# Patient Record
Sex: Female | Born: 1975 | Race: White | Hispanic: No | Marital: Married | Smoking: Never smoker
Health system: Southern US, Community
[De-identification: ages and names within clinical notes are randomized; demographics above are authoritative.]

## PROBLEM LIST (undated history)

## (undated) DIAGNOSIS — H8109 Meniere's disease, unspecified ear: Secondary | ICD-10-CM

## (undated) DIAGNOSIS — E079 Disorder of thyroid, unspecified: Secondary | ICD-10-CM

## (undated) DIAGNOSIS — E785 Hyperlipidemia, unspecified: Secondary | ICD-10-CM

## (undated) HISTORY — DX: Hyperlipidemia, unspecified: E78.5

## (undated) HISTORY — PX: OTHER SURGICAL HISTORY: SHX169

## (undated) HISTORY — DX: Meniere's disease, unspecified ear: H81.09

## (undated) HISTORY — PX: ABDOMINAL HYSTERECTOMY: SHX81

## (undated) HISTORY — DX: Disorder of thyroid, unspecified: E07.9

---

## 2013-02-04 ENCOUNTER — Encounter: Payer: Self-pay | Admitting: Nurse Practitioner

## 2013-02-04 ENCOUNTER — Ambulatory Visit (INDEPENDENT_AMBULATORY_CARE_PROVIDER_SITE_OTHER): Payer: BC Managed Care – PPO | Admitting: Nurse Practitioner

## 2013-02-04 VITALS — BP 133/88 | HR 74 | Temp 97.3°F | Ht 64.0 in | Wt 165.5 lb

## 2013-02-04 DIAGNOSIS — E039 Hypothyroidism, unspecified: Secondary | ICD-10-CM

## 2013-02-04 DIAGNOSIS — R1013 Epigastric pain: Secondary | ICD-10-CM

## 2013-02-04 DIAGNOSIS — R079 Chest pain, unspecified: Secondary | ICD-10-CM

## 2013-02-04 DIAGNOSIS — E785 Hyperlipidemia, unspecified: Secondary | ICD-10-CM

## 2013-02-04 DIAGNOSIS — Z0000 Encounter for general adult medical examination without abnormal findings: Secondary | ICD-10-CM

## 2013-02-04 LAB — THYROID PANEL WITH TSH
Free Thyroxine Index: 3 (ref 1.0–3.9)
T4, Total: 14 ug/dL — ABNORMAL HIGH (ref 5.0–12.5)
TSH: 2.75 u[IU]/mL (ref 0.350–4.500)

## 2013-02-04 LAB — COMPLETE METABOLIC PANEL WITH GFR
ALT: 9 U/L (ref 0–35)
AST: 14 U/L (ref 0–37)
BUN: 12 mg/dL (ref 6–23)
Calcium: 9.3 mg/dL (ref 8.4–10.5)
Creat: 1.1 mg/dL (ref 0.50–1.10)
Total Bilirubin: 0.3 mg/dL (ref 0.3–1.2)

## 2013-02-04 MED ORDER — LEVOTHYROXINE SODIUM 75 MCG PO TABS: 75.0000 ug | ORAL_TABLET | Freq: Every day | ORAL | Status: DC

## 2013-02-04 MED ORDER — PANTOPRAZOLE SODIUM 40 MG PO TBEC: 40.0000 mg | DELAYED_RELEASE_TABLET | Freq: Every day | ORAL | Status: DC

## 2013-02-04 NOTE — Progress Notes (Addendum)
Subjective:    Patient ID: Tonya Hoffman, female    DOB: 05/05/1976, 37 y.o.   MRN: [id number]  HPI Patient in complaining of pain chest pain. Started . intermittent. Rates pain 3-4/10. Nothing helps pain just goes away on it own. Nothing  increases pain. Usually feel sin evenings and at night when she lays down. Eats a lot of spicy and fatty foods. HYPOTHYROIDISM- Levothyroxine no C/O side effects Hyperlipidemia- Pt stopped meds because she thinks they make her feel bad. Some muscle achiness    Review of Systems  Constitutional: Negative.   HENT: Negative.   Eyes: Negative.   Respiratory: Positive for chest tightness. Negative for cough, choking and shortness of breath.   Cardiovascular: Positive for chest pain. Negative for palpitations and leg swelling.  Gastrointestinal: Negative for diarrhea. Constipation: constant   Endocrine: Negative.   Genitourinary: Negative.   Musculoskeletal: Negative.   Allergic/Immunologic: Negative.   Neurological: Negative.   Hematological: Negative.   Psychiatric/Behavioral: Negative.    Allergies  Allergen Reactions  . Doxycycline     Outpatient Encounter Prescriptions as of 02/04/2013  Medication Sig Dispense Refill  . estrogens, conjugated, (PREMARIN) 0.625 MG tablet Take 0.625 mg by mouth daily. Take daily for 21 days then do not take for 7 days.      Marland Kitchen levothyroxine (SYNTHROID, LEVOTHROID) 75 MCG tablet Take 75 mcg by mouth daily.      Marland Kitchen atorvastatin (LIPITOR) 40 MG tablet Take 40 mg by mouth daily.      . fenofibrate (TRICOR) 145 MG tablet Take 145 mg by mouth daily.       No facility-administered encounter medications on file as of 02/04/2013.    Past Medical History  Diagnosis Date  . Thyroid disease     Past Surgical History  Procedure Laterality Date  . Cesarean section    . Abdominal hysterectomy    . Laporascopy      History   Social History  . Marital Status: Married    Spouse Name: N/A    Number of Children:  N/A  . Years of Education: N/A   Occupational History  . Not on file.   Social History Main Topics  . Smoking status: Never Smoker   . Smokeless tobacco: Not on file  . Alcohol Use: No  . Drug Use: No  . Sexually Active: Yes    Birth Control/ Protection: Surgical   Other Topics Concern  . Not on file   Social History Narrative  . No narrative on file          Objective:   Physical Exam  Constitutional: She is oriented to person, place, and time. She appears well-developed and well-nourished. No distress.  HENT:  Head: Normocephalic.  Nose: Nose normal.  Eyes: EOM are normal. Pupils are equal, round, and reactive to light.  Neck: Normal range of motion. Neck supple. No tracheal deviation present. No thyromegaly present.  Cardiovascular: Normal rate, regular rhythm, normal heart sounds and intact distal pulses.  Exam reveals no gallop and no friction rub.   No murmur heard. Pulmonary/Chest: Effort normal and breath sounds normal.  Abdominal: Soft. Bowel sounds are normal. She exhibits no mass. There is tenderness (midepigastric). There is no rebound and no guarding.  Musculoskeletal: Normal range of motion.  Lymphadenopathy:    She has no cervical adenopathy.  Neurological: She is alert and oriented to person, place, and time.  Skin: Skin is warm and dry. She is not diaphoretic.  Psychiatric: She has a  normal mood and affect. Her behavior is normal. Judgment and thought content normal.  BP 133/88  Pulse 74  Temp(Src) 97.3 F (36.3 C) (Oral)  Ht 5\' 4"  (1.626 m)  Wt 165 lb 8 oz (75.07 kg)  BMI 28.39 kg/m2  LMP 02/04/2005  EKG NSR       Assessment & Plan:  1. Chest Pain - EKG 12-Lead  2. Abdominal pain, epigastric protonix 40mg  1 PO qd Avoid spicy and fatty foods No acidic drinks 3.Other and unspecified hyperlipidemia  - COMPLETE METABOLIC PANEL WITH GFR - NMR Lipoprofile with Lipids Patient really needs to go back on meds. Will wait and see what labs  look like  4. Unspecified hypothyroidism - Thyroid Panel With TSH - levothyroxine (SYNTHROID, LEVOTHROID) 75 MCG tablet; Take 1 tablet (75 mcg total) by mouth daily.  Dispense: 30 tablet; Refill: 5  Tonya Daphine Deutscher, FNP

## 2013-02-04 NOTE — Patient Instructions (Signed)
Diet for Gastroesophageal Reflux Disease, Adult  Reflux (acid reflux) is when acid from your stomach flows up into the esophagus. When acid comes in contact with the esophagus, the acid causes irritation and soreness (inflammation) in the esophagus. When reflux happens often or so severely that it causes damage to the esophagus, it is called gastroesophageal reflux disease (GERD). Nutrition therapy can help ease the discomfort of GERD.  FOODS OR DRINKS TO AVOID OR LIMIT   Smoking or chewing tobacco. Nicotine is one of the most potent stimulants to acid production in the gastrointestinal tract.   Caffeinated and decaffeinated coffee and black tea.   Regular or low-calorie carbonated beverages or energy drinks (caffeine-free carbonated beverages are allowed).    Strong spices, such as black pepper, white pepper, red pepper, cayenne, curry powder, and chili powder.   Peppermint or spearmint.   Chocolate.   High-fat foods, including meats and fried foods. Extra added fats including oils, butter, salad dressings, and nuts. Limit these to less than 8 tsp per day.   Fruits and vegetables if they are not tolerated, such as citrus fruits or tomatoes.   Alcohol.   Any food that seems to aggravate your condition.  If you have questions regarding your diet, call your caregiver or a registered dietitian.  OTHER THINGS THAT MAY HELP GERD INCLUDE:    Eating your meals slowly, in a relaxed setting.   Eating 5 to 6 small meals per day instead of 3 large meals.   Eliminating food for a period of time if it causes distress.   Not lying down until 3 hours after eating a meal.   Keeping the head of your bed raised 6 to 9 inches (15 to 23 cm) by using a foam wedge or blocks under the legs of the bed. Lying flat may make symptoms worse.   Being physically active. Weight loss may be helpful in reducing reflux in overweight or obese adults.   Wear loose fitting clothing  EXAMPLE MEAL PLAN  This meal plan is approximately  2,000 calories based on [employer] meal planning guidelines.  Breakfast    cup cooked oatmeal.   1 cup strawberries.   1 cup low-fat milk.   1 oz almonds.  Snack   1 cup cucumber slices.   6 oz yogurt (made from low-fat or fat-free milk).  Lunch   2 slice whole-wheat bread.   2 oz sliced turkey.   2 tsp mayonnaise.   1 cup blueberries.   1 cup snap peas.  Snack   6 whole-wheat crackers.   1 oz string cheese.  Dinner    cup brown rice.   1 cup mixed veggies.   1 tsp olive oil.   3 oz grilled fish.  Document Released: 10/31/2005 Document Revised: 01/23/2012 Document Reviewed: 09/16/2011  [employer] Patient Information 2013 [employer].

## 2013-02-04 NOTE — Addendum Note (Signed)
Addended by: Bennie Pierini on: 02/04/2013 05:49 PM   Modules accepted: Orders

## 2013-02-06 ENCOUNTER — Encounter: Payer: Self-pay | Admitting: Nurse Practitioner

## 2013-02-06 LAB — NMR LIPOPROFILE WITH LIPIDS
Cholesterol, Total: 271 mg/dL — ABNORMAL HIGH (ref <200)
HDL Particle Number: 32.9 umol/L (ref >=30.5)
HDL-C: 49 mg/dL (ref >=40)
LDL (calc): 167 mg/dL — ABNORMAL HIGH (ref <100)
LDL Particle Number: 2625 nmol/L — ABNORMAL HIGH (ref <1000)
LDL Size: 20.2 nm — ABNORMAL LOW (ref >20.5)
LP-IR Score: 43 (ref <=45)
Small LDL Particle Number: 1704 nmol/L — ABNORMAL HIGH (ref <=527)
VLDL Size: 49.8 nm — ABNORMAL HIGH (ref >=46.6)

## 2013-02-06 NOTE — Progress Notes (Deleted)
  Subjective:    Patient ID: Tonya Hoffman, female    DOB: [birth year], 36 y.o.   MRN: [id number]  HPI    Review of Systems     Objective:   Physical Exam        Assessment & Plan:   CPE

## 2013-02-06 NOTE — Progress Notes (Signed)
Patient aware of labs.  

## 2013-03-29 ENCOUNTER — Telehealth: Payer: Self-pay | Admitting: Nurse Practitioner

## 2013-03-29 DIAGNOSIS — E039 Hypothyroidism, unspecified: Secondary | ICD-10-CM

## 2013-03-29 DIAGNOSIS — K219 Gastro-esophageal reflux disease without esophagitis: Secondary | ICD-10-CM

## 2013-03-29 MED ORDER — LEVOTHYROXINE SODIUM 75 MCG PO TABS: 75.0000 ug | ORAL_TABLET | Freq: Every day | ORAL | Status: DC

## 2013-03-29 MED ORDER — PANTOPRAZOLE SODIUM 40 MG PO TBEC: 40.0000 mg | DELAYED_RELEASE_TABLET | Freq: Every day | ORAL | Status: DC

## 2013-03-29 NOTE — Telephone Encounter (Signed)
protonix and levothyroxine rx sent to pharmacy

## 2013-03-29 NOTE — Telephone Encounter (Signed)
Mmm to address 

## 2013-03-30 NOTE — Telephone Encounter (Signed)
Pt aware of meds at her pharmacy

## 2013-04-05 ENCOUNTER — Telehealth: Payer: Self-pay | Admitting: Nurse Practitioner

## 2013-04-09 ENCOUNTER — Telehealth: Payer: Self-pay | Admitting: Nurse Practitioner

## 2013-04-10 NOTE — Telephone Encounter (Signed)
rx printed by accident, so i called it into [facility] on 04/09/13

## 2013-06-10 ENCOUNTER — Other Ambulatory Visit: Payer: Self-pay | Admitting: Nurse Practitioner

## 2013-08-20 ENCOUNTER — Telehealth: Payer: Self-pay | Admitting: Nurse Practitioner

## 2013-08-20 NOTE — Telephone Encounter (Signed)
pATIENT HAS AN APPOINTMENT 10:30 ON 08/22/13 WITH MMM

## 2013-08-22 ENCOUNTER — Ambulatory Visit: Payer: Self-pay | Admitting: Nurse Practitioner

## 2013-08-26 ENCOUNTER — Encounter: Payer: Self-pay | Admitting: General Practice

## 2013-08-26 ENCOUNTER — Encounter (INDEPENDENT_AMBULATORY_CARE_PROVIDER_SITE_OTHER): Payer: Self-pay

## 2013-08-26 ENCOUNTER — Ambulatory Visit (INDEPENDENT_AMBULATORY_CARE_PROVIDER_SITE_OTHER): Payer: BC Managed Care – PPO | Admitting: General Practice

## 2013-08-26 VITALS — BP 122/81 | HR 68 | Temp 97.7°F | Ht 64.0 in | Wt 170.5 lb

## 2013-08-26 DIAGNOSIS — R5383 Other fatigue: Secondary | ICD-10-CM

## 2013-08-26 DIAGNOSIS — E785 Hyperlipidemia, unspecified: Secondary | ICD-10-CM

## 2013-08-26 DIAGNOSIS — E039 Hypothyroidism, unspecified: Secondary | ICD-10-CM

## 2013-08-26 DIAGNOSIS — R5381 Other malaise: Secondary | ICD-10-CM

## 2013-08-26 NOTE — Progress Notes (Signed)
  Subjective:    Patient ID: Tonya Hoffman, female    DOB: [birth year], 37 y.o.   MRN: [id number]  HPI Patient presents today for follow up of chronic health conditions. She has a history of hypothyroidism, hyperlipidemia, vasomotor symptoms, and GERD. She reports taking medications as prescribed. Denies any questions or concerns. Reports eating a healthy diet and regular exercise.     Review of Systems  Constitutional: Negative for fever and chills.  Respiratory: Negative for chest tightness and shortness of breath.   Cardiovascular: Negative for chest pain.  Gastrointestinal: Negative for nausea, vomiting, abdominal pain, diarrhea and constipation.  Genitourinary: Negative for hematuria and difficulty urinating.  Neurological: Negative for dizziness, weakness, numbness and headaches.  Psychiatric/Behavioral: Negative for sleep disturbance. The patient is not nervous/anxious.        Objective:   Physical Exam  Constitutional: She is oriented to person, place, and time. She appears well-developed and well-nourished.  HENT:  Head: Normocephalic and atraumatic.  Right Ear: External ear normal.  Left Ear: External ear normal.  Nose: Nose normal.  Mouth/Throat: Oropharynx is clear and moist.  Eyes: EOM are normal. Pupils are equal, round, and reactive to light.  Neck: Normal range of motion. Neck supple. No thyromegaly present.  Cardiovascular: Normal rate, regular rhythm and normal heart sounds.   Pulmonary/Chest: Effort normal and breath sounds normal. No respiratory distress. She exhibits no tenderness.  Abdominal: Soft. Bowel sounds are normal. She exhibits no distension. There is no tenderness.  Musculoskeletal: She exhibits no edema and no tenderness.  Lymphadenopathy:    She has no cervical adenopathy.  Neurological: She is alert and oriented to person, place, and time.  Skin: Skin is warm and dry.  Psychiatric: She has a normal mood and affect.          Assessment & Plan:   1. Unspecified hypothyroidism  - Thyroid Panel With TSH; Future  2. Fatigue  - POCT CBC - CMP14+EGFR; Future - Vitamin B12; Future - Vit D  25 hydroxy (rtn osteoporosis monitoring); Future  3. Other and unspecified hyperlipidemia  - NMR, lipoprofile; Future Continue all current medications Labs pending F/u in 3 months Discussed exercise and diet  Patient verbalized understanding Coralie Keens, FNP-C

## 2013-08-26 NOTE — Patient Instructions (Addendum)
Fatigue Fatigue is a feeling of tiredness, lack of energy, lack of motivation, or feeling tired all the time. Having enough rest, good nutrition, and reducing stress will normally reduce fatigue. Consult your caregiver if it persists. The nature of your fatigue will help your caregiver to find out its cause. The treatment is based on the cause.  CAUSES  There are many causes for fatigue. Most of the time, fatigue can be traced to one or more of your habits or routines. Most causes fit into one or more of three general areas. They are: Lifestyle problems  Sleep disturbances.  Overwork.  Physical exertion.  Unhealthy habits.  Poor eating habits or eating disorders.  Alcohol and/or drug use .  Lack of proper nutrition (malnutrition). Psychological problems  Stress and/or anxiety problems.  Depression.  Grief.  Boredom. Medical Problems or Conditions  Anemia.  Pregnancy.  Thyroid gland problems.  Recovery from major surgery.  Continuous pain.  Emphysema or asthma that is not well controlled  Allergic conditions.  Diabetes.  Infections (such as mononucleosis).  Obesity.  Sleep disorders, such as sleep apnea.  Heart failure or other heart-related problems.  Cancer.  Kidney disease.  Liver disease.  Effects of certain medicines such as antihistamines, cough and cold remedies, prescription pain medicines, heart and blood pressure medicines, drugs used for treatment of cancer, and some antidepressants. SYMPTOMS  The symptoms of fatigue include:   Lack of energy.  Lack of drive (motivation).  Drowsiness.  Feeling of indifference to the surroundings. DIAGNOSIS  The details of how you feel help guide your caregiver in finding out what is causing the fatigue. You will be asked about your present and past health condition. It is important to review all medicines that you take, including prescription and non-prescription items. A thorough exam will be done.  You will be questioned about your feelings, habits, and normal lifestyle. Your caregiver may suggest blood tests, urine tests, or other tests to look for common medical causes of fatigue.  TREATMENT  Fatigue is treated by correcting the underlying cause. For example, if you have continuous pain or depression, treating these causes will improve how you feel. Similarly, adjusting the dose of certain medicines will help in reducing fatigue.  HOME CARE INSTRUCTIONS   Try to get the required amount of good sleep every night.  Eat a healthy and nutritious diet, and drink enough water throughout the day.  Practice ways of relaxing (including yoga or meditation).  Exercise regularly.  Make plans to change situations that cause stress. Act on those plans so that stresses decrease over time. Keep your work and personal routine reasonable.  Avoid street drugs and minimize use of alcohol.  Start taking a daily multivitamin after consulting your caregiver. SEEK MEDICAL CARE IF:   You have persistent tiredness, which cannot be accounted for.  You have fever.  You have unintentional weight loss.  You have headaches.  You have disturbed sleep throughout the night.  You are feeling sad.  You have constipation.  You have dry skin.  You have gained weight.  You are taking any new or different medicines that you suspect are causing fatigue.  You are unable to sleep at night.  You develop any unusual swelling of your legs or other parts of your body. SEEK IMMEDIATE MEDICAL CARE IF:   You are feeling confused.  Your vision is blurred.  You feel faint or pass out.  You develop severe headache.  You develop severe abdominal, pelvic, or   back pain.  You develop chest pain, shortness of breath, or an irregular or fast heartbeat.  You are unable to pass a normal amount of urine.  You develop abnormal bleeding such as bleeding from the rectum or you vomit blood.  You have thoughts  about harming yourself or committing suicide.  You are worried that you might harm someone else. MAKE SURE YOU:   Understand these instructions.  Will watch your condition.  Will get help right away if you are not doing well or get worse. Document Released: 08/28/2007 Document Revised: 01/23/2012 Document Reviewed: 08/28/2007 [facility] Patient Information 2014 [place], [place].  Hypertriglyceridemia  Diet for High blood levels of Triglycerides Most fats in food are triglycerides. Triglycerides in your blood are stored as fat in your body. High levels of triglycerides in your blood may put you at a greater risk for heart disease and stroke.  Normal triglyceride levels are less than 150 mg/dL. Borderline high levels are 150-199 mg/dl. High levels are 200 - 499 mg/dL, and very high triglyceride levels are greater than 500 mg/dL. The decision to treat high triglycerides is generally based on the level. For people with borderline or high triglyceride levels, treatment includes weight loss and exercise. Drugs are recommended for people with very high triglyceride levels. Many people who need treatment for high triglyceride levels have metabolic syndrome. This syndrome is a collection of disorders that often include: insulin resistance, high blood pressure, blood clotting problems, high cholesterol and triglycerides. TESTING PROCEDURE FOR TRIGLYCERIDES  You should not eat 4 hours before getting your triglycerides measured. The normal range of triglycerides is between 10 and 250 milligrams per deciliter (mg/dl). Some people may have extreme levels (1000 or above), but your triglyceride level may be too high if it is above 150 mg/dl, depending on what other risk factors you have for heart disease.  People with high blood triglycerides may also have high blood cholesterol levels. If you have high blood cholesterol as well as high blood triglycerides, your risk for heart disease is probably greater than if  you only had high triglycerides. High blood cholesterol is one of the main risk factors for heart disease. CHANGING YOUR DIET  Your weight can affect your blood triglyceride level. If you are more than 20% above your ideal body weight, you may be able to lower your blood triglycerides by losing weight. Eating less and exercising regularly is the best way to combat this. Fat provides more calories than any other food. The best way to lose weight is to eat less fat. Only 30% of your total calories should come from fat. Less than 7% of your diet should come from saturated fat. A diet low in fat and saturated fat is the same as a diet to decrease blood cholesterol. By eating a diet lower in fat, you may lose weight, lower your blood cholesterol, and lower your blood triglyceride level.  Eating a diet low in fat, especially saturated fat, may also help you lower your blood triglyceride level. Ask your dietitian to help you figure how much fat you can eat based on the number of calories your caregiver has prescribed for you.  Exercise, in addition to helping with weight loss may also help lower triglyceride levels.   Alcohol can increase blood triglycerides. You may need to stop drinking alcoholic beverages.  Too much carbohydrate in your diet may also increase your blood triglycerides. Some complex carbohydrates are necessary in your diet. These may include bread, rice, potatoes, other  starchy vegetables and cereals.  Reduce "simple" carbohydrates. These may include pure sugars, candy, honey, and jelly without losing other nutrients. If you have the kind of high blood triglycerides that is affected by the amount of carbohydrates in your diet, you will need to eat less sugar and less high-sugar foods. Your caregiver can help you with this.  Adding 2-4 grams of fish oil (EPA+ DHA) may also help lower triglycerides. Speak with your caregiver before adding any supplements to your regimen. Following the Diet   Maintain your ideal weight. Your caregivers can help you with a diet. Generally, eating less food and getting more exercise will help you lose weight. Joining a weight control group may also help. Ask your caregivers for a good weight control group in your area.  Eat low-fat foods instead of high-fat foods. This can help you lose weight too.  These foods are lower in fat. Eat MORE of these:   Dried beans, peas, and lentils.  Egg whites.  Low-fat cottage cheese.  Fish.  Lean cuts of meat, such as round, sirloin, rump, and flank (cut extra fat off meat you fix).  Whole grain breads, cereals and pasta.  Skim and nonfat dry milk.  Low-fat yogurt.  Poultry without the skin.  Cheese made with skim or part-skim milk, such as mozzarella, parmesan, farmers', ricotta, or pot cheese. These are higher fat foods. Eat LESS of these:   Whole milk and foods made from whole milk, such as American, blue, cheddar, monterey jack, and swiss cheese  High-fat meats, such as luncheon meats, sausages, knockwurst, bratwurst, hot dogs, ribs, corned beef, ground pork, and regular ground beef.  Fried foods. Limit saturated fats in your diet. Substituting unsaturated fat for saturated fat may decrease your blood triglyceride level. You will need to read package labels to know which products contain saturated fats.  These foods are high in saturated fat. Eat LESS of these:   Fried pork skins.  Whole milk.  Skin and fat from poultry.  Palm oil.  Butter.  Shortening.  Cream cheese.  Tomasa Blase.  Margarines and baked goods made from listed oils.  Vegetable shortenings.  Chitterlings.  Fat from meats.  Coconut oil.  Palm kernel oil.  Lard.  Cream.  Sour cream.  Fatback.  Coffee whiteners and non-dairy creamers made with these oils.  Cheese made from whole milk. Use unsaturated fats (both polyunsaturated and monounsaturated) moderately. Remember, even though unsaturated fats are  better than saturated fats; you still want a diet low in total fat.  These foods are high in unsaturated fat:   Canola oil.  Sunflower oil.  Mayonnaise.  Almonds.  Peanuts.  Pine nuts.  Margarines made with these oils.  Safflower oil.  Olive oil.  Avocados.  Cashews.  Peanut butter.  Sunflower seeds.  Soybean oil.  Peanut oil.  Olives.  Pecans.  Walnuts.  Pumpkin seeds. Avoid sugar and other high-sugar foods. This will decrease carbohydrates without decreasing other nutrients. Sugar in your food goes rapidly to your blood. When there is excess sugar in your blood, your liver may use it to make more triglycerides. Sugar also contains calories without other important nutrients.  Eat LESS of these:   Sugar, brown sugar, powdered sugar, jam, jelly, preserves, honey, syrup, molasses, pies, candy, cakes, cookies, frosting, pastries, colas, soft drinks, punches, fruit drinks, and regular gelatin.  Avoid alcohol. Alcohol, even more than sugar, may increase blood triglycerides. In addition, alcohol is high in calories and low in nutrients. Ask  for sparkling water, or a diet soft drink instead of an alcoholic beverage. Suggestions for planning and preparing meals   Bake, broil, grill or roast meats instead of frying.  Remove fat from meats and skin from poultry before cooking.  Add spices, herbs, lemon juice or vinegar to vegetables instead of salt, rich sauces or gravies.  Use a non-stick skillet without fat or use no-stick sprays.  Cool and refrigerate stews and broth. Then remove the hardened fat floating on the surface before serving.  Refrigerate meat drippings and skim off fat to make low-fat gravies.  Serve more fish.  Use less butter, margarine and other high-fat spreads on bread or vegetables.  Use skim or reconstituted non-fat dry milk for cooking.  Cook with low-fat cheeses.  Substitute low-fat yogurt or cottage cheese for all or part of the sour  cream in recipes for sauces, dips or congealed salads.  Use half yogurt/half mayonnaise in salad recipes.  Substitute evaporated skim milk for cream. Evaporated skim milk or reconstituted non-fat dry milk can be whipped and substituted for whipped cream in certain recipes.  Choose fresh fruits for dessert instead of high-fat foods such as pies or cakes. Fruits are naturally low in fat. When Dining Out   Order low-fat appetizers such as fruit or vegetable juice, pasta with vegetables or tomato sauce.  Select clear, rather than cream soups.  Ask that dressings and gravies be served on the side. Then use less of them.  Order foods that are baked, broiled, poached, steamed, stir-fried, or roasted.  Ask for margarine instead of butter, and use only a small amount.  Drink sparkling water, unsweetened tea or coffee, or diet soft drinks instead of alcohol or other sweet beverages. QUESTIONS AND ANSWERS ABOUT OTHER FATS IN THE BLOOD: SATURATED FAT, TRANS FAT, AND CHOLESTEROL What is trans fat? Trans fat is a type of fat that is formed when vegetable oil is hardened through a process called hydrogenation. This process helps makes foods more solid, gives them shape, and prolongs their shelf life. Trans fats are also called hydrogenated or partially hydrogenated oils.  What do saturated fat, trans fat, and cholesterol in foods have to do with heart disease? Saturated fat, trans fat, and cholesterol in the diet all raise the level of LDL "bad" cholesterol in the blood. The higher the LDL cholesterol, the greater the risk for coronary heart disease (CHD). Saturated fat and trans fat raise LDL similarly.  What foods contain saturated fat, trans fat, and cholesterol? High amounts of saturated fat are found in animal products, such as fatty cuts of meat, chicken skin, and full-fat dairy products like butter, whole milk, cream, and cheese, and in tropical vegetable oils such as palm, palm kernel, and coconut  oil. Trans fat is found in some of the same foods as saturated fat, such as vegetable shortening, some margarines (especially hard or stick margarine), crackers, cookies, baked goods, fried foods, salad dressings, and other processed foods made with partially hydrogenated vegetable oils. Small amounts of trans fat also occur naturally in some animal products, such as milk products, beef, and lamb. Foods high in cholesterol include liver, other organ meats, egg yolks, shrimp, and full-fat dairy products. How can I use the new food label to make heart-healthy food choices? Check the Nutrition Facts panel of the food label. Choose foods lower in saturated fat, trans fat, and cholesterol. For saturated fat and cholesterol, you can also use the Percent Daily Value (%DV): 5% DV or less  is low, and 20% DV or more is high. (There is no %DV for trans fat.) Use the Nutrition Facts panel to choose foods low in saturated fat and cholesterol, and if the trans fat is not listed, read the ingredients and limit products that list shortening or hydrogenated or partially hydrogenated vegetable oil, which tend to be high in trans fat. POINTS TO REMEMBER:   Discuss your risk for heart disease with your caregivers, and take steps to reduce risk factors.  Change your diet. Choose foods that are low in saturated fat, trans fat, and cholesterol.  Add exercise to your daily routine if it is not already being done. Participate in physical activity of moderate intensity, like brisk walking, for at least 30 minutes on most, and preferably all days of the week. No time? Break the 30 minutes into three, 10-minute segments during the day.  Stop smoking. If you do smoke, contact your caregiver to discuss ways in which they can help you quit.  Do not use street drugs.  Maintain a normal weight.  Maintain a healthy blood pressure.  Keep up with your blood work for checking the fats in your blood as directed by your  caregiver. Document Released: 08/18/2004 Document Revised: 05/01/2012 Document Reviewed: 03/16/2009 [facility] Patient Information 2014 [place], [place].

## 2013-08-27 ENCOUNTER — Other Ambulatory Visit (INDEPENDENT_AMBULATORY_CARE_PROVIDER_SITE_OTHER): Payer: BC Managed Care – PPO

## 2013-08-27 DIAGNOSIS — E785 Hyperlipidemia, unspecified: Secondary | ICD-10-CM

## 2013-08-27 DIAGNOSIS — E039 Hypothyroidism, unspecified: Secondary | ICD-10-CM

## 2013-08-27 DIAGNOSIS — R5381 Other malaise: Secondary | ICD-10-CM

## 2013-08-27 DIAGNOSIS — R5383 Other fatigue: Secondary | ICD-10-CM

## 2013-08-29 LAB — CMP14+EGFR
ALT: 12 IU/L (ref 0–32)
AST: 19 IU/L (ref 0–40)
Albumin: 4.3 g/dL (ref 3.5–5.5)
Alkaline Phosphatase: 69 IU/L (ref 39–117)
BUN/Creatinine Ratio: 10 (ref 8–20)
CO2: 26 mmol/L (ref 18–29)
Calcium: 9.1 mg/dL (ref 8.7–10.2)
Chloride: 104 mmol/L (ref 97–108)
GFR calc Af Amer: 85 mL/min/{1.73_m2} (ref >59)
Glucose: 84 mg/dL (ref 65–99)
Potassium: 4.3 mmol/L (ref 3.5–5.2)
Sodium: 143 mmol/L (ref 134–144)
Total Bilirubin: 0.3 mg/dL (ref 0.0–1.2)
Total Protein: 6.6 g/dL (ref 6.0–8.5)

## 2013-08-29 LAB — NMR, LIPOPROFILE
Cholesterol: 219 mg/dL — ABNORMAL HIGH (ref <200)
HDL Cholesterol by NMR: 45 mg/dL (ref >=40)
HDL Particle Number: 29.5 umol/L — ABNORMAL LOW (ref >=30.5)
LDL Particle Number: 1983 nmol/L — ABNORMAL HIGH (ref <1000)
LDL Size: 20.8 nm (ref >20.5)
LP-IR Score: 31 (ref <=45)

## 2013-08-29 LAB — THYROID PANEL WITH TSH
T3 Uptake Ratio: 26 % (ref 24–39)
TSH: 2.25 u[IU]/mL (ref 0.450–4.500)

## 2013-08-29 LAB — VITAMIN D 25 HYDROXY (VIT D DEFICIENCY, FRACTURES): Vit D, 25-Hydroxy: 29.1 ng/mL — ABNORMAL LOW (ref 30.0–100.0)

## 2013-10-24 ENCOUNTER — Other Ambulatory Visit: Payer: Self-pay | Admitting: Nurse Practitioner

## 2014-07-14 ENCOUNTER — Telehealth: Payer: Self-pay | Admitting: General Practice

## 2014-07-14 NOTE — Telephone Encounter (Signed)
Patient had labwork done by [facility] and will bring in for review. She will need med refills soon.

## 2014-08-27 ENCOUNTER — Other Ambulatory Visit: Payer: Self-pay | Admitting: General Practice

## 2014-08-28 NOTE — Telephone Encounter (Signed)
Last ov and labs 10/14. ntbs.

## 2014-08-28 NOTE — Telephone Encounter (Signed)
no more refills without being seen  

## 2014-08-29 NOTE — Telephone Encounter (Signed)
Message left that patient will need to be seen for any further refills

## 2014-09-23 ENCOUNTER — Telehealth: Payer: Self-pay | Admitting: Nurse Practitioner

## 2014-09-23 MED ORDER — LEVOTHYROXINE SODIUM 75 MCG PO TABS: ORAL_TABLET | ORAL | Status: DC

## 2014-09-23 NOTE — Telephone Encounter (Signed)
done

## 2014-12-09 ENCOUNTER — Ambulatory Visit (INDEPENDENT_AMBULATORY_CARE_PROVIDER_SITE_OTHER): Payer: BLUE CROSS/BLUE SHIELD | Admitting: Family

## 2014-12-09 ENCOUNTER — Encounter (INDEPENDENT_AMBULATORY_CARE_PROVIDER_SITE_OTHER): Payer: Self-pay

## 2014-12-09 ENCOUNTER — Encounter: Payer: Self-pay | Admitting: Family

## 2014-12-09 VITALS — BP 118/81 | HR 71 | Temp 98.0°F | Ht 64.0 in | Wt 178.2 lb

## 2014-12-09 DIAGNOSIS — R5383 Other fatigue: Secondary | ICD-10-CM

## 2014-12-09 DIAGNOSIS — E559 Vitamin D deficiency, unspecified: Secondary | ICD-10-CM

## 2014-12-09 DIAGNOSIS — E039 Hypothyroidism, unspecified: Secondary | ICD-10-CM

## 2014-12-09 DIAGNOSIS — Z0000 Encounter for general adult medical examination without abnormal findings: Secondary | ICD-10-CM

## 2014-12-09 DIAGNOSIS — J4520 Mild intermittent asthma, uncomplicated: Secondary | ICD-10-CM

## 2014-12-09 DIAGNOSIS — J45909 Unspecified asthma, uncomplicated: Secondary | ICD-10-CM | POA: Insufficient documentation

## 2014-12-09 DIAGNOSIS — E785 Hyperlipidemia, unspecified: Secondary | ICD-10-CM

## 2014-12-09 MED ORDER — ALBUTEROL SULFATE HFA 108 (90 BASE) MCG/ACT IN AERS: 2.0000 | INHALATION_SPRAY | Freq: Four times a day (QID) | RESPIRATORY_TRACT | Status: DC | PRN

## 2014-12-09 MED ORDER — LEVOTHYROXINE SODIUM 75 MCG PO TABS: ORAL_TABLET | ORAL | Status: DC

## 2014-12-09 MED ORDER — BUDESONIDE-FORMOTEROL FUMARATE 80-4.5 MCG/ACT IN AERO: 2.0000 | INHALATION_SPRAY | Freq: Two times a day (BID) | RESPIRATORY_TRACT | Status: DC

## 2014-12-09 MED ORDER — MONTELUKAST SODIUM 10 MG PO TABS: 10.0000 mg | ORAL_TABLET | Freq: Every day | ORAL | Status: DC

## 2014-12-09 NOTE — Addendum Note (Signed)
Addended by: Orma RenderHODGES, Lillyn Wieczorek F on: 12/09/2014 01:26 PM   Modules accepted: Orders

## 2014-12-09 NOTE — Progress Notes (Addendum)
Subjective:    Patient ID: Tonya Hoffman, female    DOB: 08-Jul-1976, 39 y.o.   MRN: [id number]  Pt presents for CPE with the following chronic problems: Thyroid Problem Presents for follow-up visit. Symptoms include cold intolerance, constipation, dry skin, fatigue, hair loss, palpitations and weight gain. Patient reports no anxiety, depressed mood, diarrhea or leg swelling. The symptoms have been worsening. Past treatments include levothyroxine. The treatment provided moderate relief. Her past medical history is significant for hyperlipidemia. There is no history of diabetes or heart failure.  Asthma She complains of cough. There is no difficulty breathing, hemoptysis, shortness of breath, sputum production or wheezing. This is a chronic problem. The current episode started more than 1 year ago. The problem occurs intermittently. The problem has been waxing and waning. The cough is non-productive. Associated symptoms include dyspnea on exertion and malaise/fatigue. Pertinent negatives include no headaches, nasal congestion, postnasal drip or sneezing. Her symptoms are not alleviated by rest. Her past medical history is significant for asthma. There is no history of COPD.      Review of Systems  Constitutional: Positive for weight gain, malaise/fatigue and fatigue.  HENT: Negative.  Negative for postnasal drip and sneezing.   Eyes: Negative.   Respiratory: Positive for cough. Negative for hemoptysis, sputum production, shortness of breath and wheezing.   Cardiovascular: Positive for dyspnea on exertion and palpitations.  Gastrointestinal: Positive for constipation. Negative for diarrhea.  Endocrine: Positive for cold intolerance.  Genitourinary: Negative.   Musculoskeletal: Negative.   Neurological: Negative.  Negative for headaches.  Hematological: Negative.   Psychiatric/Behavioral: Negative.  The patient is not nervous/anxious.   All other systems reviewed and are negative.        Objective:   Physical Exam  Constitutional: She is oriented to person, place, and time. She appears well-developed and well-nourished. No distress.  HENT:  Head: Normocephalic and atraumatic.  Right Ear: External ear normal.  Left Ear: External ear normal.  Nose: Nose normal.  Mouth/Throat: Oropharynx is clear and moist.  Eyes: Pupils are equal, round, and reactive to light.  Neck: Normal range of motion. Neck supple. No thyromegaly present.  Cardiovascular: Normal rate, regular rhythm, normal heart sounds and intact distal pulses.   No murmur heard. Pulmonary/Chest: Effort normal and breath sounds normal. No respiratory distress. She has no wheezes.  Abdominal: Soft. Bowel sounds are normal. She exhibits no distension. There is no tenderness.  Musculoskeletal: Normal range of motion. She exhibits no edema or tenderness.  Neurological: She is alert and oriented to person, place, and time. She has normal reflexes. No cranial nerve deficit.  Skin: Skin is warm and dry.  Psychiatric: She has a normal mood and affect. Her behavior is normal. Judgment and thought content normal.  Vitals reviewed.   BP 118/81 mmHg  Pulse 71  Temp(Src) 98 F (36.7 C) (Oral)  Ht $R'5\' 4"'KU$  (1.626 m)  Wt 178 lb 3.2 oz (80.831 kg)  BMI 30.57 kg/m2  LMP 02/04/2005       Assessment & Plan:  1. Hypothyroidism, unspecified hypothyroidism type - CMP14+EGFR; Future - levothyroxine (SYNTHROID, LEVOTHROID) 75 MCG tablet; TAKE ONE TABLET BY MOUTH ONCE DAILY  Dispense: 90 tablet; Refill: 3 - Thyroid antibodies  2. Asthma, chronic, mild intermittent, uncomplicated - OIZ12+WPYK; Future - budesonide-formoterol (SYMBICORT) 80-4.5 MCG/ACT inhaler; Inhale 2 puffs into the lungs 2 (two) times daily. two puffs twice daily  Dispense: 1 Inhaler; Refill: 11 - montelukast (SINGULAIR) 10 MG tablet; Take 1 tablet (10 mg total)  by mouth at bedtime.  Dispense: 90 tablet; Refill: 3 - albuterol (PROVENTIL HFA;VENTOLIN HFA) 108  (90 BASE) MCG/ACT inhaler; Inhale 2 puffs into the lungs every 6 (six) hours as needed for wheezing or shortness of breath.  Dispense: 1 Inhaler; Refill: 2  3. Hyperlipidemia - Lipid panel; Future  4. Other fatigue - Thyroid Panel With TSH; Future - Vit D  25 hydroxy (rtn osteoporosis monitoring); Future - Vitamin B12; Future  5. Vitamin D deficiency - Vit D  25 hydroxy (rtn osteoporosis monitoring); Future - Vitamin B12; Future   Continue all meds Labs pending Health Maintenance reviewed Diet and exercise encouraged RTO 1 year  Evelina Dun, FNP

## 2014-12-09 NOTE — Patient Instructions (Addendum)
Asthma Asthma is a recurring condition in which the airways tighten and narrow. Asthma can make it difficult to breathe. It can cause coughing, wheezing, and shortness of breath. Asthma episodes, also called asthma attacks, range from minor to life-threatening. Asthma cannot be cured, but medicines and lifestyle changes can help control it. CAUSES Asthma is believed to be caused by inherited (genetic) and environmental factors, but its exact cause is unknown. Asthma may be triggered by allergens, lung infections, or irritants in the air. Asthma triggers are different for each person. Common triggers include:   Animal dander.  Dust mites.  Cockroaches.  Pollen from trees or grass.  Mold.  Smoke.  Air pollutants such as dust, household cleaners, hair sprays, aerosol sprays, paint fumes, strong chemicals, or strong odors.  Cold air, weather changes, and winds (which increase molds and pollens in the air).  Strong emotional expressions such as crying or laughing hard.  Stress.  Certain medicines (such as aspirin) or types of drugs (such as beta-blockers).  Sulfites in foods and drinks. Foods and drinks that may contain sulfites include dried fruit, potato chips, and sparkling grape juice.  Infections or inflammatory conditions such as the flu, a cold, or an inflammation of the nasal membranes (rhinitis).  Gastroesophageal reflux disease (GERD).  Exercise or strenuous activity. SYMPTOMS Symptoms may occur immediately after asthma is triggered or many hours later. Symptoms include:  Wheezing.  Excessive nighttime or early morning coughing.  Frequent or severe coughing with a common cold.  Chest tightness.  Shortness of breath. DIAGNOSIS  The diagnosis of asthma is made by a review of your medical history and a physical exam. Tests may also be performed. These may include:  Lung function studies. These tests show how much air you breathe in and out.  Allergy  tests.  Imaging tests such as X-rays. TREATMENT  Asthma cannot be cured, but it can usually be controlled. Treatment involves identifying and avoiding your asthma triggers. It also involves medicines. There are 2 classes of medicine used for asthma treatment:   Controller medicines. These prevent asthma symptoms from occurring. They are usually taken every day.  Reliever or rescue medicines. These quickly relieve asthma symptoms. They are used as needed and provide short-term relief. Your health care provider will help you create an asthma action plan. An asthma action plan is a written plan for managing and treating your asthma attacks. It includes a list of your asthma triggers and how they may be avoided. It also includes information on when medicines should be taken and when their dosage should be changed. An action plan may also involve the use of a device called a peak flow meter. A peak flow meter measures how well the lungs are working. It helps you monitor your condition. HOME CARE INSTRUCTIONS   Take medicines only as directed by your health care provider. Speak with your health care provider if you have questions about how or when to take the medicines.  Use a peak flow meter as directed by your health care provider. Record and keep track of readings.  Understand and use the action plan to help minimize or stop an asthma attack without needing to seek medical care.  Control your home environment in the following ways to help prevent asthma attacks:  Do not smoke. Avoid being exposed to secondhand smoke.  Change your heating and air conditioning filter regularly.  Limit your use of fireplaces and wood stoves.  Get rid of pests (such as roaches and  mice) and their droppings.  Throw away plants if you see mold on them.  Clean your floors and dust regularly. Use unscented cleaning products.  Try to have someone else vacuum for you regularly. Stay out of rooms while they are  being vacuumed and for a short while afterward. If you vacuum, use a dust mask from a hardware store, a double-layered or microfilter vacuum cleaner bag, or a vacuum cleaner with a HEPA filter.  Replace carpet with wood, tile, or vinyl flooring. Carpet can trap dander and dust.  Use allergy-proof pillows, mattress covers, and box spring covers.  Wash bed sheets and blankets every week in hot water and dry them in a dryer.  Use blankets that are made of polyester or cotton.  Clean bathrooms and kitchens with bleach. If possible, have someone repaint the walls in these rooms with mold-resistant paint. Keep out of the rooms that are being cleaned and painted.  Wash hands frequently. SEEK MEDICAL CARE IF:   You have wheezing, shortness of breath, or a cough even if taking medicine to prevent attacks.  The colored mucus you cough up (sputum) is thicker than usual.  Your sputum changes from clear or white to yellow, green, gray, or bloody.  You have any problems that may be related to the medicines you are taking (such as a rash, itching, swelling, or trouble breathing).  You are using a reliever medicine more than 2-3 times per week.  Your peak flow is still at 50-79% of your personal best after following your action plan for 1 hour.  You have a fever. SEEK IMMEDIATE MEDICAL CARE IF:   You seem to be getting worse and are unresponsive to treatment during an asthma attack.  You are short of breath even at rest.  You get short of breath when doing very little physical activity.  You have difficulty eating, drinking, or talking due to asthma symptoms.  You develop chest pain.  You develop a fast heartbeat.  You have a bluish color to your lips or fingernails.  You are light-headed, dizzy, or faint.  Your peak flow is less than 50% of your personal best. MAKE SURE YOU:   Understand these instructions.  Will watch your condition.  Will get help right away if you are not  doing well or get worse. Document Released: 10/31/2005 Document Revised: 03/17/2014 Document Reviewed: 05/30/2013 [employer] Patient Information 2015 [place], [place]. This information is not intended to replace advice given to you by your health care provider. Make sure you discuss any questions you have with your health care provider. Health Maintenance Adopting a healthy lifestyle and getting preventive care can go a long way to promote health and wellness. Talk with your health care provider about what schedule of regular examinations is right for you. This is a good chance for you to check in with your provider about disease prevention and staying healthy. In between checkups, there are plenty of things you can do on your own. Experts have done a lot of research about which lifestyle changes and preventive measures are most likely to keep you healthy. Ask your health care provider for more information. WEIGHT AND DIET  Eat a healthy diet  Be sure to include plenty of vegetables, fruits, low-fat dairy products, and lean protein.  Do not eat a lot of foods high in solid fats, added sugars, or salt.  Get regular exercise. This is one of the most important things you can do for your health.  Most  adults should exercise for at least 150 minutes each week. The exercise should increase your heart rate and make you sweat (moderate-intensity exercise).  Most adults should also do strengthening exercises at least twice a week. This is in addition to the moderate-intensity exercise.  Maintain a healthy weight  Body mass index (BMI) is a measurement that can be used to identify possible weight problems. It estimates body fat based on height and weight. Your health care provider can help determine your BMI and help you achieve or maintain a healthy weight.  For females 39 years of age and older:   A BMI below 18.5 is considered underweight.  A BMI of 18.5 to 24.9 is normal.  A BMI of 25 to 29.9 is  considered overweight.  A BMI of 30 and above is considered obese.  Watch levels of cholesterol and blood lipids  You should start having your blood tested for lipids and cholesterol at 41 years of age, then have this test every 5 years.  You may need to have your cholesterol levels checked more often if:  Your lipid or cholesterol levels are high.  You are older than 41 years of age.  You are at high risk for heart disease.  CANCER SCREENING   Lung Cancer  Lung cancer screening is recommended for adults 17-85 years old who are at high risk for lung cancer because of a history of smoking.  A yearly low-dose CT scan of the lungs is recommended for people who:  Currently smoke.  Have quit within the past 15 years.  Have at least a 30-pack-year history of smoking. A pack year is smoking an average of one pack of cigarettes a day for 1 year.  Yearly screening should continue until it has been 15 years since you quit.  Yearly screening should stop if you develop a health problem that would prevent you from having lung cancer treatment.  Breast Cancer  Practice breast self-awareness. This means understanding how your breasts normally appear and feel.  It also means doing regular breast self-exams. Let your health care provider know about any changes, no matter how small.  If you are in your 20s or 30s, you should have a clinical breast exam (CBE) by a health care provider every 1-3 years as part of a regular health exam.  If you are 32 or older, have a CBE every year. Also consider having a breast X-ray (mammogram) every year.  If you have a family history of breast cancer, talk to your health care provider about genetic screening.  If you are at high risk for breast cancer, talk to your health care provider about having an MRI and a mammogram every year.  Breast cancer gene (BRCA) assessment is recommended for women who have family members with BRCA-related cancers.  BRCA-related cancers include:  Breast.  Ovarian.  Tubal.  Peritoneal cancers.  Results of the assessment will determine the need for genetic counseling and BRCA1 and BRCA2 testing. Cervical Cancer Routine pelvic examinations to screen for cervical cancer are no longer recommended for nonpregnant women who are considered low risk for cancer of the pelvic organs (ovaries, uterus, and vagina) and who do not have symptoms. A pelvic examination may be necessary if you have symptoms including those associated with pelvic infections. Ask your health care provider if a screening pelvic exam is right for you.   The Pap test is the screening test for cervical cancer for women who are considered at risk.  If  you had a hysterectomy for a problem that was not cancer or a condition that could lead to cancer, then you no longer need Pap tests.  If you are older than 65 years, and you have had normal Pap tests for the past 10 years, you no longer need to have Pap tests.  If you have had past treatment for cervical cancer or a condition that could lead to cancer, you need Pap tests and screening for cancer for at least 20 years after your treatment.  If you no longer get a Pap test, assess your risk factors if they change (such as having a new sexual partner). This can affect whether you should start being screened again.  Some women have medical problems that increase their chance of getting cervical cancer. If this is the case for you, your health care provider may recommend more frequent screening and Pap tests.  The human papillomavirus (HPV) test is another test that may be used for cervical cancer screening. The HPV test looks for the virus that can cause cell changes in the cervix. The cells collected during the Pap test can be tested for HPV.  The HPV test can be used to screen women 20 years of age and older. Getting tested for HPV can extend the interval between normal Pap tests from three to  five years.  An HPV test also should be used to screen women of any age who have unclear Pap test results.  After 41 years of age, women should have HPV testing as often as Pap tests.  Colorectal Cancer  This type of cancer can be detected and often prevented.  Routine colorectal cancer screening usually begins at 41 years of age and continues through 41 years of age.  Your health care provider may recommend screening at an earlier age if you have risk factors for colon cancer.  Your health care provider may also recommend using home test kits to check for hidden blood in the stool.  A small camera at the end of a tube can be used to examine your colon directly (sigmoidoscopy or colonoscopy). This is done to check for the earliest forms of colorectal cancer.  Routine screening usually begins at age 40.  Direct examination of the colon should be repeated every 5-10 years through 41 years of age. However, you may need to be screened more often if early forms of precancerous polyps or small growths are found. Skin Cancer  Check your skin from head to toe regularly.  Tell your health care provider about any new moles or changes in moles, especially if there is a change in a mole's shape or color.  Also tell your health care provider if you have a mole that is larger than the size of a pencil eraser.  Always use sunscreen. Apply sunscreen liberally and repeatedly throughout the day.  Protect yourself by wearing long sleeves, pants, a wide-brimmed hat, and sunglasses whenever you are outside. HEART DISEASE, DIABETES, AND HIGH BLOOD PRESSURE   Have your blood pressure checked at least every 1-2 years. High blood pressure causes heart disease and increases the risk of stroke.  If you are between 72 years and 108 years old, ask your health care provider if you should take aspirin to prevent strokes.  Have regular diabetes screenings. This involves taking a blood sample to check your  fasting blood sugar level.  If you are at a normal weight and have a low risk for diabetes, have this test once  every three years after 41 years of age.  If you are overweight and have a high risk for diabetes, consider being tested at a younger age or more often. PREVENTING INFECTION  Hepatitis B  If you have a higher risk for hepatitis B, you should be screened for this virus. You are considered at high risk for hepatitis B if:  You were born in a country where hepatitis B is common. Ask your health care provider which countries are considered high risk.  Your parents were born in a high-risk country, and you have not been immunized against hepatitis B (hepatitis B vaccine).  You have HIV or AIDS.  You use needles to inject street drugs.  You live with someone who has hepatitis B.  You have had sex with someone who has hepatitis B.  You get hemodialysis treatment.  You take certain medicines for conditions, including cancer, organ transplantation, and autoimmune conditions. Hepatitis C  Blood testing is recommended for:  Everyone born from [birth year] through 1967.  Anyone with known risk factors for hepatitis C. Sexually transmitted infections (STIs)  You should be screened for sexually transmitted infections (STIs) including gonorrhea and chlamydia if:  You are sexually active and are younger than 41 years of age.  You are older than 41 years of age and your health care provider tells you that you are at risk for this type of infection.  Your sexual activity has changed since you were last screened and you are at an increased risk for chlamydia or gonorrhea. Ask your health care provider if you are at risk.  If you do not have HIV, but are at risk, it may be recommended that you take a prescription medicine daily to prevent HIV infection. This is called pre-exposure prophylaxis (PrEP). You are considered at risk if:  You are sexually active and do not regularly use condoms or  know the HIV status of your partner(s).  You take drugs by injection.  You are sexually active with a partner who has HIV. Talk with your health care provider about whether you are at high risk of being infected with HIV. If you choose to begin PrEP, you should first be tested for HIV. You should then be tested every 3 months for as long as you are taking PrEP.  PREGNANCY   If you are premenopausal and you may become pregnant, ask your health care provider about preconception counseling.  If you may become pregnant, take 400 to 800 micrograms (mcg) of folic acid every day.  If you want to prevent pregnancy, talk to your health care provider about birth control (contraception). OSTEOPOROSIS AND MENOPAUSE   Osteoporosis is a disease in which the bones lose minerals and strength with aging. This can result in serious bone fractures. Your risk for osteoporosis can be identified using a bone density scan.  If you are 32 years of age or older, or if you are at risk for osteoporosis and fractures, ask your health care provider if you should be screened.  Ask your health care provider whether you should take a calcium or vitamin D supplement to lower your risk for osteoporosis.  Menopause may have certain physical symptoms and risks.  Hormone replacement therapy may reduce some of these symptoms and risks. Talk to your health care provider about whether hormone replacement therapy is right for you.  HOME CARE INSTRUCTIONS   Schedule regular health, dental, and eye exams.  Stay current with your immunizations.   Do not use  any tobacco products including cigarettes, chewing tobacco, or electronic cigarettes.  If you are pregnant, do not drink alcohol.  If you are breastfeeding, limit how much and how often you drink alcohol.  Limit alcohol intake to no more than 1 drink per day for nonpregnant women. One drink equals 12 ounces of beer, 5 ounces of wine, or 1 ounces of hard liquor.  Do  not use street drugs.  Do not share needles.  Ask your health care provider for help if you need support or information about quitting drugs.  Tell your health care provider if you often feel depressed.  Tell your health care provider if you have ever been abused or do not feel safe at home. Document Released: 05/16/2011 Document Revised: 03/17/2014 Document Reviewed: 10/02/2013 [facility] Patient Information 2015 [place], [place]. This information is not intended to replace advice given to you by your health care provider. Make sure you discuss any questions you have with your health care provider.

## 2014-12-10 LAB — LIPID PANEL
CHOL/HDL RATIO: 5.3 ratio — AB (ref 0.0–4.4)
Cholesterol, Total: 248 mg/dL — ABNORMAL HIGH (ref 100–199)
HDL: 47 mg/dL (ref >39)
LDL CALC: 177 mg/dL — AB (ref 0–99)
Triglycerides: 119 mg/dL (ref 0–149)
VLDL Cholesterol Cal: 24 mg/dL (ref 5–40)

## 2014-12-10 LAB — THYROID PANEL WITH TSH
FREE THYROXINE INDEX: 2.6 (ref 1.2–4.9)
T3 Uptake Ratio: 26 % (ref 24–39)
T4, Total: 9.9 ug/dL (ref 4.5–12.0)
TSH: 3.37 u[IU]/mL (ref 0.450–4.500)

## 2014-12-10 LAB — CMP14+EGFR
ALK PHOS: 77 IU/L (ref 39–117)
ALT: 15 IU/L (ref 0–32)
AST: 18 IU/L (ref 0–40)
Albumin/Globulin Ratio: 2 (ref 1.1–2.5)
Albumin: 4.5 g/dL (ref 3.5–5.5)
BILIRUBIN TOTAL: 0.5 mg/dL (ref 0.0–1.2)
BUN / CREAT RATIO: 11 (ref 9–23)
BUN: 10 mg/dL (ref 6–24)
CHLORIDE: 102 mmol/L (ref 97–108)
CO2: 25 mmol/L (ref 18–29)
CREATININE: 0.87 mg/dL (ref 0.57–1.00)
Calcium: 9.6 mg/dL (ref 8.7–10.2)
GFR calc non Af Amer: 84 mL/min/{1.73_m2} (ref >59)
GFR, EST AFRICAN AMERICAN: 96 mL/min/{1.73_m2} (ref >59)
Globulin, Total: 2.2 g/dL (ref 1.5–4.5)
Glucose: 94 mg/dL (ref 65–99)
Potassium: 5.1 mmol/L (ref 3.5–5.2)
Sodium: 141 mmol/L (ref 134–144)
Total Protein: 6.7 g/dL (ref 6.0–8.5)

## 2014-12-10 LAB — THYROID ANTIBODIES
THYROGLOBULIN ANTIBODY: 3.8 [IU]/mL — AB (ref 0.0–0.9)
Thyroid Peroxidase Ab: 214 IU/mL — ABNORMAL HIGH (ref 0–34)

## 2014-12-10 LAB — VITAMIN D 25 HYDROXY (VIT D DEFICIENCY, FRACTURES): Vit D, 25-Hydroxy: 27.5 ng/mL — ABNORMAL LOW (ref 30.0–100.0)

## 2014-12-10 LAB — VITAMIN B12: Vitamin B-12: 627 pg/mL (ref 211–946)

## 2014-12-12 ENCOUNTER — Other Ambulatory Visit: Payer: Self-pay | Admitting: Family

## 2014-12-12 ENCOUNTER — Telehealth: Payer: Self-pay | Admitting: *Deleted

## 2014-12-12 DIAGNOSIS — E559 Vitamin D deficiency, unspecified: Secondary | ICD-10-CM

## 2014-12-12 DIAGNOSIS — R768 Other specified abnormal immunological findings in serum: Secondary | ICD-10-CM

## 2014-12-12 DIAGNOSIS — E785 Hyperlipidemia, unspecified: Secondary | ICD-10-CM | POA: Insufficient documentation

## 2014-12-12 MED ORDER — VITAMIN D (ERGOCALCIFEROL) 1.25 MG (50000 UNIT) PO CAPS: 50000.0000 [IU] | ORAL_CAPSULE | ORAL | Status: DC

## 2014-12-12 MED ORDER — SIMVASTATIN 20 MG PO TABS: 20.0000 mg | ORAL_TABLET | Freq: Every day | ORAL | Status: DC

## 2014-12-12 NOTE — Telephone Encounter (Signed)
Attempted to call pt to go over test results.

## 2014-12-12 NOTE — Telephone Encounter (Signed)
-----   Message from Junie Spencerhristy A Hawks, FNP sent at 12/12/2014  8:46 AM EST ----- Thyroid antibodies abnormal- Referral to Endocrinologist since pt is symptomatic  Kidney and liver function stable Cholesterol levels high- RX sent to pharmacy Thyroid levels (TSH, T3, & T4)- WNL Vit D levels low- RX sent to pharmacy Vit B 12 levels WNL

## 2015-10-27 ENCOUNTER — Telehealth: Payer: Self-pay | Admitting: Family

## 2015-10-27 NOTE — Telephone Encounter (Signed)
Please address

## 2015-10-27 NOTE — Telephone Encounter (Signed)
She will need to call our office to schedule an appointment for the provider to discuss sleep problems for documentation.  If needed , the provider would then do a referral for a sleep study.

## 2015-10-27 NOTE — Telephone Encounter (Signed)
Patient NTBS for follow up and lab work and referral

## 2016-01-12 ENCOUNTER — Other Ambulatory Visit: Payer: Self-pay | Admitting: Family

## 2016-01-22 ENCOUNTER — Telehealth: Payer: Self-pay | Admitting: Family

## 2016-01-25 MED ORDER — LEVOTHYROXINE SODIUM 75 MCG PO TABS: 75.0000 ug | ORAL_TABLET | Freq: Every day | ORAL | Status: DC

## 2016-01-25 NOTE — Telephone Encounter (Signed)
done

## 2016-01-26 ENCOUNTER — Encounter: Payer: BLUE CROSS/BLUE SHIELD | Admitting: Family

## 2016-01-27 ENCOUNTER — Encounter: Payer: Self-pay | Admitting: Family

## 2016-02-01 ENCOUNTER — Encounter: Payer: Self-pay | Admitting: Family

## 2016-02-01 ENCOUNTER — Ambulatory Visit (INDEPENDENT_AMBULATORY_CARE_PROVIDER_SITE_OTHER): Payer: BLUE CROSS/BLUE SHIELD | Admitting: Family

## 2016-02-01 VITALS — BP 122/90 | HR 75 | Temp 97.1°F | Ht 64.0 in | Wt 173.2 lb

## 2016-02-01 DIAGNOSIS — R5383 Other fatigue: Secondary | ICD-10-CM

## 2016-02-01 DIAGNOSIS — E039 Hypothyroidism, unspecified: Secondary | ICD-10-CM

## 2016-02-01 DIAGNOSIS — E785 Hyperlipidemia, unspecified: Secondary | ICD-10-CM

## 2016-02-01 DIAGNOSIS — J4520 Mild intermittent asthma, uncomplicated: Secondary | ICD-10-CM

## 2016-02-01 DIAGNOSIS — Z0000 Encounter for general adult medical examination without abnormal findings: Secondary | ICD-10-CM | POA: Diagnosis not present

## 2016-02-01 DIAGNOSIS — E559 Vitamin D deficiency, unspecified: Secondary | ICD-10-CM

## 2016-02-01 DIAGNOSIS — R0683 Snoring: Secondary | ICD-10-CM

## 2016-02-01 DIAGNOSIS — E669 Obesity, unspecified: Secondary | ICD-10-CM

## 2016-02-01 NOTE — Progress Notes (Signed)
Subjective:    Patient ID: Tonya Hoffman, female    DOB: October 24, 1976, 40 y.o.   MRN: [id number]  Pt presents for CPE with the following chronic problems: Thyroid Problem Presents for follow-up visit. Symptoms include cold intolerance, constipation, dry skin, fatigue, hair loss, palpitations and weight gain. Patient reports no anxiety, depressed mood, diarrhea or leg swelling. The symptoms have been worsening. Past treatments include levothyroxine. The treatment provided moderate relief. Her past medical history is significant for hyperlipidemia. There is no history of diabetes or heart failure.  Asthma She complains of frequent throat clearing. There is no cough, difficulty breathing, hemoptysis, shortness of breath, sputum production or wheezing. This is a chronic problem. The current episode started more than 1 year ago. The problem occurs intermittently. The problem has been waxing and waning. The cough is non-productive. Associated symptoms include dyspnea on exertion and malaise/fatigue. Pertinent negatives include no headaches, nasal congestion, postnasal drip or sneezing. Her symptoms are not alleviated by rest. Her past medical history is significant for asthma. There is no history of COPD.      Review of Systems  Constitutional: Positive for weight gain, malaise/fatigue and fatigue.  HENT: Negative.  Negative for postnasal drip and sneezing.   Eyes: Negative.   Respiratory: Negative for cough, hemoptysis, sputum production, shortness of breath and wheezing.   Cardiovascular: Positive for dyspnea on exertion and palpitations.  Gastrointestinal: Positive for constipation. Negative for diarrhea.  Endocrine: Positive for cold intolerance.  Genitourinary: Negative.   Musculoskeletal: Negative.   Neurological: Negative.  Negative for headaches.  Hematological: Negative.   Psychiatric/Behavioral: Negative.  The patient is not nervous/anxious.   All other systems reviewed and are  negative.      Objective:   Physical Exam  Constitutional: She is oriented to person, place, and time. She appears well-developed and well-nourished. No distress.  HENT:  Head: Normocephalic and atraumatic.  Right Ear: External ear normal.  Left Ear: External ear normal.  Nose: Nose normal.  Mouth/Throat: Oropharynx is clear and moist.  Eyes: Pupils are equal, round, and reactive to light.  Neck: Normal range of motion. Neck supple. No thyromegaly present.  Cardiovascular: Normal rate, regular rhythm, normal heart sounds and intact distal pulses.   No murmur heard. Pulmonary/Chest: Effort normal and breath sounds normal. No respiratory distress. She has no wheezes.  Abdominal: Soft. Bowel sounds are normal. She exhibits no distension. There is no tenderness.  Musculoskeletal: Normal range of motion. She exhibits no edema or tenderness.  Neurological: She is alert and oriented to person, place, and time. She has normal reflexes. No cranial nerve deficit.  Skin: Skin is warm and dry.  Psychiatric: She has a normal mood and affect. Her behavior is normal. Judgment and thought content normal.  Vitals reviewed.   BP 122/90 mmHg  Pulse 75  Temp(Src) 97.1 F (36.2 C) (Oral)  Ht '5\' 4"'$  (1.626 m)  Wt 173 lb 3.2 oz (78.563 kg)  BMI 29.72 kg/m2  LMP 02/04/2005       Assessment & Plan:  1. Asthma, chronic, mild intermittent, uncomplicated - OJJ00+XFGH  2. Hypothyroidism, unspecified hypothyroidism type - CMP14+EGFR - Thyroid Panel With TSH  3. Vitamin D deficiency - CMP14+EGFR - VITAMIN D 25 Hydroxy (Vit-D Deficiency, Fractures)  4. Hyperlipidemia -Pt does not want to be on statins- Will try low fat diet and exercise - CMP14+EGFR - Lipid panel  5. Obesity - CMP14+EGFR  6. Annual physical exam - CMP14+EGFR - Lipid panel - Thyroid Panel With TSH -  VITAMIN D 25 Hydroxy (Vit-D Deficiency, Fractures) - Anemia Profile B  7. Other fatigue - CMP14+EGFR - Thyroid Panel  With TSH - VITAMIN D 25 Hydroxy (Vit-D Deficiency, Fractures) - Anemia Profile B - H Pylori, IGM, IGG, IGA AB - Heavy Metals, Blood  8. Snoring - CMP14+EGFR - Ambulatory referral to Sleep Studies   Continue all meds Labs pending Health Maintenance reviewed Diet and exercise encouraged RTO 6 months  Evelina Dun, FNP

## 2016-02-01 NOTE — Patient Instructions (Signed)
Health Maintenance, Female Adopting a healthy lifestyle and getting preventive care can go a long way to promote health and wellness. Talk with your health care provider about what schedule of regular examinations is right for you. This is a good chance for you to check in with your provider about disease prevention and staying healthy. In between checkups, there are plenty of things you can do on your own. Experts have done a lot of research about which lifestyle changes and preventive measures are most likely to keep you healthy. Ask your health care provider for more information. WEIGHT AND DIET  Eat a healthy diet  Be sure to include plenty of vegetables, fruits, low-fat dairy products, and lean protein.  Do not eat a lot of foods high in solid fats, added sugars, or salt.  Get regular exercise. This is one of the most important things you can do for your health.  Most adults should exercise for at least 150 minutes each week. The exercise should increase your heart rate and make you sweat (moderate-intensity exercise).  Most adults should also do strengthening exercises at least twice a week. This is in addition to the moderate-intensity exercise.  Maintain a healthy weight  Body mass index (BMI) is a measurement that can be used to identify possible weight problems. It estimates body fat based on height and weight. Your health care provider can help determine your BMI and help you achieve or maintain a healthy weight.  For females 20 years of age and older:   A BMI below 18.5 is considered underweight.  A BMI of 18.5 to 24.9 is normal.  A BMI of 25 to 29.9 is considered overweight.  A BMI of 30 and above is considered obese.  Watch levels of cholesterol and blood lipids  You should start having your blood tested for lipids and cholesterol at 42 years of age, then have this test every 5 years.  You may need to have your cholesterol levels checked more often if:  Your lipid  or cholesterol levels are high.  You are older than 42 years of age.  You are at high risk for heart disease.  CANCER SCREENING   Lung Cancer  Lung cancer screening is recommended for adults 55-80 years old who are at high risk for lung cancer because of a history of smoking.  A yearly low-dose CT scan of the lungs is recommended for people who:  Currently smoke.  Have quit within the past 15 years.  Have at least a 30-pack-year history of smoking. A pack year is smoking an average of one pack of cigarettes a day for 1 year.  Yearly screening should continue until it has been 15 years since you quit.  Yearly screening should stop if you develop a health problem that would prevent you from having lung cancer treatment.  Breast Cancer  Practice breast self-awareness. This means understanding how your breasts normally appear and feel.  It also means doing regular breast self-exams. Let your health care provider know about any changes, no matter how small.  If you are in your 20s or 30s, you should have a clinical breast exam (CBE) by a health care provider every 1-3 years as part of a regular health exam.  If you are 40 or older, have a CBE every year. Also consider having a breast X-ray (mammogram) every year.  If you have a family history of breast cancer, talk to your health care provider about genetic screening.  If you   are at high risk for breast cancer, talk to your health care provider about having an MRI and a mammogram every year.  Breast cancer gene (BRCA) assessment is recommended for women who have family members with BRCA-related cancers. BRCA-related cancers include:  Breast.  Ovarian.  Tubal.  Peritoneal cancers.  Results of the assessment will determine the need for genetic counseling and BRCA1 and BRCA2 testing. Cervical Cancer Your health care provider may recommend that you be screened regularly for cancer of the pelvic organs (ovaries, uterus, and  vagina). This screening involves a pelvic examination, including checking for microscopic changes to the surface of your cervix (Pap test). You may be encouraged to have this screening done every 3 years, beginning at age 21.  For women ages 30-65, health care providers may recommend pelvic exams and Pap testing every 3 years, or they may recommend the Pap and pelvic exam, combined with testing for human papilloma virus (HPV), every 5 years. Some types of HPV increase your risk of cervical cancer. Testing for HPV may also be done on women of any age with unclear Pap test results.  Other health care providers may not recommend any screening for nonpregnant women who are considered low risk for pelvic cancer and who do not have symptoms. Ask your health care provider if a screening pelvic exam is right for you.  If you have had past treatment for cervical cancer or a condition that could lead to cancer, you need Pap tests and screening for cancer for at least 20 years after your treatment. If Pap tests have been discontinued, your risk factors (such as having a new sexual partner) need to be reassessed to determine if screening should resume. Some women have medical problems that increase the chance of getting cervical cancer. In these cases, your health care provider may recommend more frequent screening and Pap tests. Colorectal Cancer  This type of cancer can be detected and often prevented.  Routine colorectal cancer screening usually begins at 42 years of age and continues through 42 years of age.  Your health care provider may recommend screening at an earlier age if you have risk factors for colon cancer.  Your health care provider may also recommend using home test kits to check for hidden blood in the stool.  A small camera at the end of a tube can be used to examine your colon directly (sigmoidoscopy or colonoscopy). This is done to check for the earliest forms of colorectal  cancer.  Routine screening usually begins at age 50.  Direct examination of the colon should be repeated every 5-10 years through 42 years of age. However, you may need to be screened more often if early forms of precancerous polyps or small growths are found. Skin Cancer  Check your skin from head to toe regularly.  Tell your health care provider about any new moles or changes in moles, especially if there is a change in a mole's shape or color.  Also tell your health care provider if you have a mole that is larger than the size of a pencil eraser.  Always use sunscreen. Apply sunscreen liberally and repeatedly throughout the day.  Protect yourself by wearing long sleeves, pants, a wide-brimmed hat, and sunglasses whenever you are outside. HEART DISEASE, DIABETES, AND HIGH BLOOD PRESSURE   High blood pressure causes heart disease and increases the risk of stroke. High blood pressure is more likely to develop in:  People who have blood pressure in the high end   of the normal range (130-139/85-89 mm Hg).  People who are overweight or obese.  People who are African American.  If you are 38-23 years of age, have your blood pressure checked every 3-5 years. If you are 61 years of age or older, have your blood pressure checked every year. You should have your blood pressure measured twice--once when you are at a hospital or clinic, and once when you are not at a hospital or clinic. Record the average of the two measurements. To check your blood pressure when you are not at a hospital or clinic, you can use:  An automated blood pressure machine at a pharmacy.  A home blood pressure monitor.  If you are between 45 years and 39 years old, ask your health care provider if you should take aspirin to prevent strokes.  Have regular diabetes screenings. This involves taking a blood sample to check your fasting blood sugar level.  If you are at a normal weight and have a low risk for diabetes,  have this test once every three years after 42 years of age.  If you are overweight and have a high risk for diabetes, consider being tested at a younger age or more often. PREVENTING INFECTION  Hepatitis B  If you have a higher risk for hepatitis B, you should be screened for this virus. You are considered at high risk for hepatitis B if:  You were born in a country where hepatitis B is common. Ask your health care provider which countries are considered high risk.  Your parents were born in a high-risk country, and you have not been immunized against hepatitis B (hepatitis B vaccine).  You have HIV or AIDS.  You use needles to inject street drugs.  You live with someone who has hepatitis B.  You have had sex with someone who has hepatitis B.  You get hemodialysis treatment.  You take certain medicines for conditions, including cancer, organ transplantation, and autoimmune conditions. Hepatitis C  Blood testing is recommended for:  Everyone born from [birth year] through 1967.  Anyone with known risk factors for hepatitis C. Sexually transmitted infections (STIs)  You should be screened for sexually transmitted infections (STIs) including gonorrhea and chlamydia if:  You are sexually active and are younger than 42 years of age.  You are older than 42 years of age and your health care provider tells you that you are at risk for this type of infection.  Your sexual activity has changed since you were last screened and you are at an increased risk for chlamydia or gonorrhea. Ask your health care provider if you are at risk.  If you do not have HIV, but are at risk, it may be recommended that you take a prescription medicine daily to prevent HIV infection. This is called pre-exposure prophylaxis (PrEP). You are considered at risk if:  You are sexually active and do not regularly use condoms or know the HIV status of your partner(s).  You take drugs by injection.  You are sexually  active with a partner who has HIV. Talk with your health care provider about whether you are at high risk of being infected with HIV. If you choose to begin PrEP, you should first be tested for HIV. You should then be tested every 3 months for as long as you are taking PrEP.  PREGNANCY   If you are premenopausal and you may become pregnant, ask your health care provider about preconception counseling.  If you may  become pregnant, take 400 to 800 micrograms (mcg) of folic acid every day.  If you want to prevent pregnancy, talk to your health care provider about birth control (contraception). OSTEOPOROSIS AND MENOPAUSE   Osteoporosis is a disease in which the bones lose minerals and strength with aging. This can result in serious bone fractures. Your risk for osteoporosis can be identified using a bone density scan.  If you are 61 years of age or older, or if you are at risk for osteoporosis and fractures, ask your health care provider if you should be screened.  Ask your health care provider whether you should take a calcium or vitamin D supplement to lower your risk for osteoporosis.  Menopause may have certain physical symptoms and risks.  Hormone replacement therapy may reduce some of these symptoms and risks. Talk to your health care provider about whether hormone replacement therapy is right for you.  HOME CARE INSTRUCTIONS   Schedule regular health, dental, and eye exams.  Stay current with your immunizations.   Do not use any tobacco products including cigarettes, chewing tobacco, or electronic cigarettes.  If you are pregnant, do not drink alcohol.  If you are breastfeeding, limit how much and how often you drink alcohol.  Limit alcohol intake to no more than 1 drink per day for nonpregnant women. One drink equals 12 ounces of beer, 5 ounces of wine, or 1 ounces of hard liquor.  Do not use street drugs.  Do not share needles.  Ask your health care provider for help if  you need support or information about quitting drugs.  Tell your health care provider if you often feel depressed.  Tell your health care provider if you have ever been abused or do not feel safe at home.   This information is not intended to replace advice given to you by your health care provider. Make sure you discuss any questions you have with your health care provider.   Document Released: 05/16/2011 Document Revised: 11/21/2014 Document Reviewed: 10/02/2013 Elsevier Interactive Patient Education [employer].

## 2016-02-02 ENCOUNTER — Telehealth: Payer: Self-pay | Admitting: Family

## 2016-02-02 LAB — H PYLORI, IGM, IGG, IGA AB

## 2016-02-02 LAB — ANEMIA PROFILE B
BASOS ABS: 0.1 10*3/uL (ref 0.0–0.2)
Basos: 1 %
EOS (ABSOLUTE): 0.1 10*3/uL (ref 0.0–0.4)
Eos: 2 %
Ferritin: 105 ng/mL (ref 15–150)
Folate: 2.7 ng/mL — ABNORMAL LOW (ref >3.0)
Hematocrit: 42.1 % (ref 34.0–46.6)
Hemoglobin: 14.5 g/dL (ref 11.1–15.9)
IMMATURE GRANS (ABS): 0 10*3/uL (ref 0.0–0.1)
IMMATURE GRANULOCYTES: 0 %
Iron Saturation: 24 % (ref 15–55)
Iron: 61 ug/dL (ref 27–159)
LYMPHS: 31 %
Lymphocytes Absolute: 1.3 10*3/uL (ref 0.7–3.1)
MCH: 28.4 pg (ref 26.6–33.0)
MCHC: 34.4 g/dL (ref 31.5–35.7)
MCV: 82 fL (ref 79–97)
MONOCYTES: 8 %
MONOS ABS: 0.4 10*3/uL (ref 0.1–0.9)
NEUTROS PCT: 58 %
Neutrophils Absolute: 2.5 10*3/uL (ref 1.4–7.0)
PLATELETS: 281 10*3/uL (ref 150–379)
RBC: 5.11 x10E6/uL (ref 3.77–5.28)
RDW: 12.7 % (ref 12.3–15.4)
RETIC CT PCT: 0.6 % (ref 0.6–2.6)
Total Iron Binding Capacity: 257 ug/dL (ref 250–450)
UIBC: 196 ug/dL (ref 131–425)
Vitamin B-12: 408 pg/mL (ref 211–946)
WBC: 4.3 10*3/uL (ref 3.4–10.8)

## 2016-02-02 LAB — LIPID PANEL
Chol/HDL Ratio: 5.2 ratio units — ABNORMAL HIGH (ref 0.0–4.4)
Cholesterol, Total: 275 mg/dL — ABNORMAL HIGH (ref 100–199)
HDL: 53 mg/dL (ref >39)
LDL Calculated: 198 mg/dL — ABNORMAL HIGH (ref 0–99)
Triglycerides: 121 mg/dL (ref 0–149)
VLDL CHOLESTEROL CAL: 24 mg/dL (ref 5–40)

## 2016-02-02 LAB — CMP14+EGFR
ALK PHOS: 63 IU/L (ref 39–117)
ALT: 8 IU/L (ref 0–32)
AST: 15 IU/L (ref 0–40)
Albumin/Globulin Ratio: 1.8 (ref 1.2–2.2)
Albumin: 4.4 g/dL (ref 3.5–5.5)
BUN / CREAT RATIO: 14 (ref 9–23)
BUN: 14 mg/dL (ref 6–24)
Bilirubin Total: 0.4 mg/dL (ref 0.0–1.2)
CO2: 22 mmol/L (ref 18–29)
Calcium: 9.4 mg/dL (ref 8.7–10.2)
Chloride: 104 mmol/L (ref 96–106)
Creatinine, Ser: 1.03 mg/dL — ABNORMAL HIGH (ref 0.57–1.00)
GFR calc non Af Amer: 68 mL/min/{1.73_m2} (ref >59)
GFR, EST AFRICAN AMERICAN: 78 mL/min/{1.73_m2} (ref >59)
GLUCOSE: 89 mg/dL (ref 65–99)
Globulin, Total: 2.4 g/dL (ref 1.5–4.5)
POTASSIUM: 4.8 mmol/L (ref 3.5–5.2)
Sodium: 144 mmol/L (ref 134–144)
TOTAL PROTEIN: 6.8 g/dL (ref 6.0–8.5)

## 2016-02-02 LAB — HEAVY METALS, BLOOD
ARSENIC: 4 ug/L (ref 2–23)
LEAD, BLOOD: 2 ug/dL (ref 0–19)
Mercury: NOT DETECTED ug/L (ref 0.0–14.9)

## 2016-02-02 LAB — VITAMIN D 25 HYDROXY (VIT D DEFICIENCY, FRACTURES): VIT D 25 HYDROXY: 31.5 ng/mL (ref 30.0–100.0)

## 2016-02-02 LAB — THYROID PANEL WITH TSH
Free Thyroxine Index: 1.4 (ref 1.2–4.9)
T3 UPTAKE RATIO: 24 % (ref 24–39)
T4, Total: 6 ug/dL (ref 4.5–12.0)
TSH: 17.85 u[IU]/mL — AB (ref 0.450–4.500)

## 2016-02-02 NOTE — Telephone Encounter (Signed)
Patient informed that we are waiting on results to be reviewed

## 2016-02-04 ENCOUNTER — Other Ambulatory Visit: Payer: Self-pay | Admitting: Family

## 2016-02-04 MED ORDER — LEVOTHYROXINE SODIUM 100 MCG PO TABS: 100.0000 ug | ORAL_TABLET | Freq: Every day | ORAL | Status: DC

## 2016-02-04 MED ORDER — EZETIMIBE 10 MG PO TABS: 10.0000 mg | ORAL_TABLET | Freq: Every day | ORAL | Status: DC

## 2016-03-24 ENCOUNTER — Encounter: Payer: Self-pay | Admitting: Pulmonary Disease

## 2016-03-24 ENCOUNTER — Ambulatory Visit (INDEPENDENT_AMBULATORY_CARE_PROVIDER_SITE_OTHER): Payer: BLUE CROSS/BLUE SHIELD | Admitting: Pulmonary Disease

## 2016-03-24 VITALS — BP 118/78 | HR 75 | Ht 64.0 in | Wt 175.0 lb

## 2016-03-24 DIAGNOSIS — J309 Allergic rhinitis, unspecified: Secondary | ICD-10-CM | POA: Diagnosis not present

## 2016-03-24 DIAGNOSIS — J4520 Mild intermittent asthma, uncomplicated: Secondary | ICD-10-CM

## 2016-03-24 DIAGNOSIS — G4700 Insomnia, unspecified: Secondary | ICD-10-CM | POA: Diagnosis not present

## 2016-03-24 DIAGNOSIS — G4710 Hypersomnia, unspecified: Secondary | ICD-10-CM | POA: Insufficient documentation

## 2016-03-24 DIAGNOSIS — E669 Obesity, unspecified: Secondary | ICD-10-CM | POA: Diagnosis not present

## 2016-03-24 NOTE — Patient Instructions (Signed)
1. We will order you a sleep lab study. Someone from the lab should call you this week or next week regarding schedule. If no one calls you by next week, please give us a call to follow-up. We will call you with results. 2. We will order you an auto CPAP if the sleep study is positive. 3. Give us a call if her having issues with CPAP.  Return to clinic in 3 mos.

## 2016-03-24 NOTE — Assessment & Plan Note (Signed)
Not too sob. Not on meds. Will observe. May need PFT, CXR on f/u.  Pt states dx was recently made.

## 2016-03-24 NOTE — Progress Notes (Signed)
Subjective:    Patient ID: Tonya Ruffiane Trost, female    DOB: 1976/01/29, 40 y.o.   MRN: [id number]  HPI   This is the case of Tonya Hoffman, 40 y.o. Female, who was referred by Jannifer Rodneyhristy Hawks FNP  in consultation regarding possible OSA.   As you very well know, patient is a non smoker.  Recent dx of asthma but she is not sure if its correct. She has allergic rhinitis.   Pt has hypersomnia, worsening thorugh the years. Sleeps at 1-3am, difficulty initiating sleep. Usually, takes 30-45 minutes to fall sleep. Has frequent awakenings as well. There is sleepy when she wakes up. Eventually, sleeps in until noon. Remains sleepy in the afternoon. Has snoring, gasping, witnessed apneas. Hypersomnia affects functionality. At night, she tries to sleep at 9 PM but is not sleepy then. ESS 12   Patient has Hashimoto's disease.    Review of Systems  Constitutional: Negative.  Negative for fever and unexpected weight change.  HENT: Positive for congestion, postnasal drip, rhinorrhea and sore throat. Negative for dental problem, ear pain, nosebleeds, sinus pressure, sneezing and trouble swallowing.   Eyes: Positive for itching. Negative for redness.  Respiratory: Positive for cough. Negative for chest tightness, shortness of breath and wheezing.   Cardiovascular: Negative.  Negative for palpitations and leg swelling.  Gastrointestinal: Negative.  Negative for nausea and vomiting.  Endocrine: Negative.   Genitourinary: Negative.  Negative for dysuria.  Musculoskeletal: Positive for myalgias and arthralgias. Negative for joint swelling.  Skin: Negative.  Negative for rash.  Allergic/Immunologic: Positive for environmental allergies.  Neurological: Positive for headaches.  Hematological: Negative.  Does not bruise/bleed easily.  Psychiatric/Behavioral: Negative.  Negative for dysphoric mood. The patient is not nervous/anxious.     Past Medical History  Diagnosis Date  . Thyroid disease   .  Hyperlipidemia    (-) CA, DVT Has hashimotos.   Family History  Problem Relation Age of Onset  . Hypertension Mother   . Heart disease Father   . Hypertension Brother      Past Surgical History  Procedure Laterality Date  . Cesarean section    . Abdominal hysterectomy    . Laporascopy      Social History   Social History  . Marital Status: Married    Spouse Name: N/A  . Number of Children: N/A  . Years of Education: N/A   Occupational History  . Not on file.   Social History Main Topics  . Smoking status: Never Smoker   . Smokeless tobacco: Not on file  . Alcohol Use: No  . Drug Use: No  . Sexual Activity: Yes    Birth Control/ Protection: Surgical   Other Topics Concern  . Not on file   Social History Narrative   Works at home.   Allergies  Allergen Reactions  . Doxycycline   . Statins     Body aches that were worse in her legs.     Outpatient Prescriptions Prior to Visit  Medication Sig Dispense Refill  . estradiol (VIVELLE-DOT) 0.05 MG/24HR patch Place 1 patch onto the skin 2 (two) times a week.    . levothyroxine (SYNTHROID) 100 MCG tablet Take 1 tablet (100 mcg total) by mouth daily before breakfast. 90 tablet 1  . Ascorbic Acid 500 MG CHEW Reported on 03/24/2016    . ezetimibe (ZETIA) 10 MG tablet Take 1 tablet (10 mg total) by mouth daily. (Patient not taking: Reported on 03/24/2016) 90 tablet 1   No  facility-administered medications prior to visit.   No orders of the defined types were placed in this encounter.           Objective:   Physical Exam  Vitals:  Filed Vitals:   03/24/16 1158  BP: 118/78  Pulse: 75  Height: 5\' 4"  (1.626 m)  Weight: 175 lb (79.379 kg)  SpO2: 99%    Constitutional/General:  Pleasant, well-nourished, well-developed, not in any distress,  Comfortably seating.  Well kempt  Body mass index is 30.02 kg/(m^2). Wt Readings from Last 3 Encounters:  03/24/16 175 lb (79.379 kg)  02/01/16 173 lb 3.2 oz (78.563  kg)  12/09/14 178 lb 3.2 oz (80.831 kg)    Neck circumference: 15 in  HEENT: Pupils equal and reactive to light and accommodation. Anicteric sclerae. Normal nasal mucosa.   No oral  lesions,  mouth clear,  oropharynx clear, no postnasal drip. (-) Oral thrush. No dental caries. (+) goiter Airway - Mallampati class III  Neck: No masses. Midline trachea. No JVD, (-) LAD. (-) bruits appreciated.  Respiratory/Chest: Grossly normal chest. (-) deformity. (-) Accessory muscle use.  Symmetric expansion. (-) Tenderness on palpation.  Resonant on percussion.  Diminished BS on both lower lung zones. (-) wheezing, crackles, rhonchi (-) egophony  Cardiovascular: Regular rate and  rhythm, heart sounds normal, no murmur or gallops, no peripheral edema  Gastrointestinal:  Normal bowel sounds. Soft, non-tender. No hepatosplenomegaly.  (-) masses.   Musculoskeletal:  Normal muscle tone. Normal gait.   Extremities: Grossly normal. (-) clubbing, cyanosis.  (-) edema  Skin: (-) rash,lesions seen.   Neurological/Psychiatric : alert, oriented to time, place, person. Normal mood and affect            Assessment & Plan:  Hypersomnia Pt has hypersomnia, worsening thorugh the years. Sleeps at 1-3am, difficulty initiating sleep. Usually, takes 30-45 minutes to fall sleep. Has frequent awakenings as well. There is sleepy when she wakes up. Eventually, sleeps in until noon. Remains sleepy in the afternoon. Has snoring, gasping, witnessed apneas. Hypersomnia affects functionality. At night, she tries to sleep at 9 PM but is not sleepy then. ESS 12 Neck circ 15 in  Plan : 1. Split-night study. Patient is okay with CPAP, wants to avoid if possible. Needs to call pt with results. If mild, may opt to just observe unless more sx with hypersomnia.  2. Likely, try auto CPAP 5-15 cm water. 3. We  discussed regarding sleep hygiene.  Insomnia Patient has sleep onset and sleep maintenance insomnia  likely secondary to untreated sleep apnea and poor sleep habits.  We discussed this >> 5 Rules of Good Sleep Hygiene: 1. Go to bed only when sleepy. 2. Try to wake up at the same time everyday.  3. If you are not able to fall asleep within the hour, get up and do something boring and monotonous.  Continue doing this until you get sleepy and you fall asleep.  4. Avoid naps. 5. The bed should just be used for sleep and intimacy.   Need to treat sleep apnea and do good sleep hygiene. I don't think she is depressed or has mental health issues.  Allergic rhinitis Recent flare from allergies. We'll consider Flonase and Zyrtec on  follow-up if worse.  Obesity Weight reduction.   Asthma, chronic Not too sob. Not on meds. Will observe. May need PFT, CXR on f/u.  Pt states dx was recently made.     Thank you very much for letting me participate in  this patient's care. Please do not hesitate to give me a call if you have any questions or concerns regarding the treatment plan.   Patient will follow up with me in 3 mos, sooner if with issues.     Pollie Meyer, MD 03/24/2016   12:38 PM Pulmonary and Critical Care Medicine [facility] Pager: [phone] Office: [phone], Fax: [phone]

## 2016-03-24 NOTE — Assessment & Plan Note (Signed)
Weight reduction 

## 2016-03-24 NOTE — Assessment & Plan Note (Signed)
Recent flare from allergies. We'll consider Flonase and Zyrtec on  follow-up if worse.

## 2016-03-24 NOTE — Assessment & Plan Note (Signed)
Patient has sleep onset and sleep maintenance insomnia likely secondary to untreated sleep apnea and poor sleep habits.  We discussed this >> 5 Rules of Good Sleep Hygiene: 1. Go to bed only when sleepy. 2. Try to wake up at the same time everyday.  3. If you are not able to fall asleep within the hour, get up and do something boring and monotonous.  Continue doing this until you get sleepy and you fall asleep.  4. Avoid naps. 5. The bed should just be used for sleep and intimacy.   Need to treat sleep apnea and do good sleep hygiene. I don't think she is depressed or has mental health issues.

## 2016-03-24 NOTE — Assessment & Plan Note (Signed)
Pt has hypersomnia, worsening thorugh the years. Sleeps at 1-3am, difficulty initiating sleep. Usually, takes 30-45 minutes to fall sleep. Has frequent awakenings as well. There is sleepy when she wakes up. Eventually, sleeps in until noon. Remains sleepy in the afternoon. Has snoring, gasping, witnessed apneas. Hypersomnia affects functionality. At night, she tries to sleep at 9 PM but is not sleepy then. ESS 12 Neck circ 15 in  Plan : 1. Split-night study. Patient is okay with CPAP, wants to avoid if possible. Needs to call pt with results. If mild, may opt to just observe unless more sx with hypersomnia.  2. Likely, try auto CPAP 5-15 cm water. 3. We  discussed regarding sleep hygiene.

## 2016-04-14 DIAGNOSIS — E559 Vitamin D deficiency, unspecified: Secondary | ICD-10-CM | POA: Diagnosis not present

## 2016-04-14 DIAGNOSIS — E038 Other specified hypothyroidism: Secondary | ICD-10-CM | POA: Diagnosis not present

## 2016-04-14 DIAGNOSIS — E042 Nontoxic multinodular goiter: Secondary | ICD-10-CM | POA: Diagnosis not present

## 2016-04-14 DIAGNOSIS — E063 Autoimmune thyroiditis: Secondary | ICD-10-CM | POA: Diagnosis not present

## 2016-04-19 ENCOUNTER — Other Ambulatory Visit: Payer: BLUE CROSS/BLUE SHIELD

## 2016-04-20 DIAGNOSIS — E042 Nontoxic multinodular goiter: Secondary | ICD-10-CM | POA: Diagnosis not present

## 2016-04-20 DIAGNOSIS — E063 Autoimmune thyroiditis: Secondary | ICD-10-CM | POA: Diagnosis not present

## 2016-04-20 DIAGNOSIS — E038 Other specified hypothyroidism: Secondary | ICD-10-CM | POA: Diagnosis not present

## 2016-05-12 ENCOUNTER — Encounter (HOSPITAL_BASED_OUTPATIENT_CLINIC_OR_DEPARTMENT_OTHER): Payer: BLUE CROSS/BLUE SHIELD

## 2016-06-27 ENCOUNTER — Ambulatory Visit: Payer: BLUE CROSS/BLUE SHIELD | Admitting: Pulmonary Disease

## 2016-07-04 ENCOUNTER — Ambulatory Visit (HOSPITAL_BASED_OUTPATIENT_CLINIC_OR_DEPARTMENT_OTHER): Payer: BLUE CROSS/BLUE SHIELD | Attending: Pulmonary Disease | Admitting: Pulmonary Disease

## 2016-07-04 DIAGNOSIS — G4710 Hypersomnia, unspecified: Secondary | ICD-10-CM | POA: Diagnosis not present

## 2016-07-04 DIAGNOSIS — I493 Ventricular premature depolarization: Secondary | ICD-10-CM | POA: Diagnosis not present

## 2016-07-04 DIAGNOSIS — G4719 Other hypersomnia: Secondary | ICD-10-CM | POA: Diagnosis not present

## 2016-07-04 DIAGNOSIS — R0683 Snoring: Secondary | ICD-10-CM | POA: Diagnosis not present

## 2016-07-05 ENCOUNTER — Telehealth: Payer: Self-pay | Admitting: Pulmonary Disease

## 2016-07-05 NOTE — Telephone Encounter (Signed)
Spoke with pt and reviewed results. She would like to wait and she how she is doing over the next few months. Appt canceled. Advised pt to call us if she wants to repeat HST or has any change in symptoms. Nothing further needed.

## 2016-07-05 NOTE — Telephone Encounter (Signed)
    Please call the pt and tell the pt the [facility]  HOME SLEEP STUDY did not show OSA.   She stopped breathing 1x/hr which is normal.   Pls ask her if she is still very sleepy and tired.  If so, we can get a HST in 1-2 mos. Pls ask her if she wants to follow up with me on 8/30 to discuss her hypersomnia or hold off as sleep study was Normal.     Thanks!   J. Alexis FrockAngelo A de Dios, MD 07/05/2016, 9:11 AM

## 2016-07-05 NOTE — Procedures (Signed)
NAME: [name] DATE OF BIRTH:  [birth year] MEDICAL RECORD NUMBER [id number]  LOCATION: [facility]  PHYSICIAN: Daneen SchickJose Angelo A De Dios  DATE OF STUDY: 07/04/2016  SLEEP STUDY TYPE: Out of Center Sleep Test                Patient Name: Tonya Hoffman, [name]   Study Date: 07/04/2016   Gender: Female  D.O.B: [birth year]  Age (years): 40  Referring Provider: Delphina CahillJose Angelo De Dios   Height (inches): 63  Interpreting Physician: Delphina CahillJose Angelo De Dios   Weight (lbs): 180  RPSGT: Wylie HailDavis, Rico   BMI: 32  MRN: [id number]  Neck Size: 15.00    CLINICAL INFORMATION  Sleep Study Type: NPSG  Indication for sleep study: Excessive Daytime Sleepiness  Epworth Sleepiness Score: 5   SLEEP STUDY TECHNIQUE  As per the AASM Manual for the Scoring of Sleep and Associated Events v2.3 (April 2016) with a hypopnea requiring 4% desaturations.  The channels recorded and monitored were frontal, central and occipital EEG, electrooculogram (EOG), submentalis EMG (chin), nasal and oral airflow, thoracic and abdominal wall motion, anterior tibialis EMG, snore microphone, electrocardiogram, and pulse oximetry.   MEDICATIONS  Patient's medications include: N/A. Medications reviewed per chart review. Medications self-administered by patient during sleep study : No sleep medicine administered.  SLEEP ARCHITECTURE  The study was initiated at 11:08:32 PM and ended at 5:15:13 AM.  Sleep onset time was 19.8 minutes and the sleep efficiency was 89.3%. The total sleep time was 327.5 minutes.  Stage REM latency was 63.0 minutes.  The patient spent 5.19% of the night in stage N1 sleep, 58.63% in stage N2 sleep, 8.55% in stage N3 and 27.63% in REM.  Alpha intrusion was absent.  Supine sleep was 19.40%.   RESPIRATORY PARAMETERS  The overall apnea/hypopnea index (AHI) was 0.9 per hour. There were 2 total apneas, including 2 obstructive, 0 central and 0 mixed apneas. There were 3 hypopneas and 22 RERAs.  The  AHI during Stage REM sleep was 0.7 per hour. AHI while supine was 1.9 per hour.  The mean oxygen saturation was 93.29%. The minimum SpO2 during sleep was 90.00%.  Moderate snoring was noted during this study.  CARDIAC DATA  The 2 lead EKG demonstrated sinus rhythm. The mean heart rate was 68.64 beats per minute. Other EKG findings include: PVCs.   LEG MOVEMENT DATA  The total PLMS were 0 with a resulting PLMS index of 0.00. Associated arousal with leg movement index was 0.0 .  IMPRESSIONS  1. No evidence for significant obstructive sleep apnea occurred during this study (AHI = 0.9/h). 2. No significant central sleep apnea occurred during this study (CAI = 0.0/h). 3. The patient had minimal or no oxygen desaturation during the study (Min O2 = 90.00%) 4. The patient snored with Moderate snoring volume. 5. EKG findings include PVCs. 6. Clinically significant periodic limb movements did not occur during sleep. No significant associated arousals.  DIAGNOSIS  No evidence for significant OSA based on this sleep study.   RECOMMENDATIONS  1. No evidence for significant OSA based on this study. If hypersomnia persists, suggest repeating a sleep study (maybe HST) in order to obatin more data. Patient has insomnia and has issues falling asleep outside her house. 2. Avoid alcohol, sedatives and other CNS depressants that may worsen sleep apnea and disrupt normal sleep architecture. 3. Sleep hygiene should be reviewed to assess factors that may improve sleep quality. 4. Weight management and regular exercise  should be initiated or continued if appropriate. 5. Follow up in the office to discuss results.   Pollie MeyerJ. Angelo A de Dios, MD 07/05/2016, 9:07 AM [facility] Pager [phone] After 3 pm or if no answer, call [phone]

## 2016-07-13 ENCOUNTER — Ambulatory Visit: Payer: BLUE CROSS/BLUE SHIELD | Admitting: Pulmonary Disease

## 2017-02-07 ENCOUNTER — Encounter: Payer: Self-pay | Admitting: Family

## 2017-02-07 ENCOUNTER — Ambulatory Visit: Payer: BLUE CROSS/BLUE SHIELD

## 2017-02-07 ENCOUNTER — Ambulatory Visit (INDEPENDENT_AMBULATORY_CARE_PROVIDER_SITE_OTHER): Payer: BLUE CROSS/BLUE SHIELD | Admitting: Family

## 2017-02-07 VITALS — BP 124/89 | HR 76 | Temp 97.4°F | Ht 63.0 in | Wt 188.0 lb

## 2017-02-07 DIAGNOSIS — E039 Hypothyroidism, unspecified: Secondary | ICD-10-CM

## 2017-02-07 DIAGNOSIS — Z79890 Hormone replacement therapy (postmenopausal): Secondary | ICD-10-CM

## 2017-02-07 DIAGNOSIS — Z0000 Encounter for general adult medical examination without abnormal findings: Secondary | ICD-10-CM | POA: Diagnosis not present

## 2017-02-07 DIAGNOSIS — Z6833 Body mass index (BMI) 33.0-33.9, adult: Secondary | ICD-10-CM | POA: Diagnosis not present

## 2017-02-07 DIAGNOSIS — R5383 Other fatigue: Secondary | ICD-10-CM

## 2017-02-07 DIAGNOSIS — E785 Hyperlipidemia, unspecified: Secondary | ICD-10-CM

## 2017-02-07 DIAGNOSIS — Z780 Asymptomatic menopausal state: Secondary | ICD-10-CM

## 2017-02-07 DIAGNOSIS — J4520 Mild intermittent asthma, uncomplicated: Secondary | ICD-10-CM

## 2017-02-07 DIAGNOSIS — J301 Allergic rhinitis due to pollen: Secondary | ICD-10-CM

## 2017-02-07 DIAGNOSIS — G4700 Insomnia, unspecified: Secondary | ICD-10-CM

## 2017-02-07 DIAGNOSIS — E559 Vitamin D deficiency, unspecified: Secondary | ICD-10-CM

## 2017-02-07 DIAGNOSIS — E669 Obesity, unspecified: Secondary | ICD-10-CM

## 2017-02-07 MED ORDER — LEVOTHYROXINE SODIUM 125 MCG PO TABS: 125.0000 ug | ORAL_TABLET | Freq: Every day | ORAL | 3 refills | Status: DC

## 2017-02-07 MED ORDER — ESTRADIOL 0.05 MG/24HR TD PTTW: 1.0000 | MEDICATED_PATCH | TRANSDERMAL | 3 refills | Status: DC

## 2017-02-07 NOTE — Addendum Note (Signed)
Addended by: Jannifer RodneyHAWKS, Conn Trombetta A on: 02/07/2017 03:57 PM   Modules accepted: Orders

## 2017-02-07 NOTE — Progress Notes (Addendum)
Subjective:    Patient ID: Tonya Hoffman Section, female    DOB: 1976/06/15, 41 y.o.   MRN: [id number]  Pt presents for CPE with the following chronic problems. Pt currently using estradiol patches  For hot flashes since her hysterectomy in 2008.  Thyroid Problem  Presents for follow-up visit. Symptoms include cold intolerance, constipation, dry skin, fatigue, palpitations and weight gain. Patient reports no anxiety, depressed mood, diarrhea, hair loss or leg swelling. The symptoms have been worsening. Past treatments include levothyroxine. The treatment provided moderate relief. Her past medical history is significant for hyperlipidemia. There is no history of diabetes or heart failure.  Asthma  She complains of frequent throat clearing. There is no cough, difficulty breathing, hemoptysis, shortness of breath, sputum production or wheezing. This is a chronic problem. The current episode started more than 1 year ago. The problem occurs intermittently. The problem has been waxing and waning. The cough is non-productive. Associated symptoms include dyspnea on exertion and malaise/fatigue. Pertinent negatives include no headaches, nasal congestion, postnasal drip or sneezing. Her symptoms are not alleviated by rest. Her past medical history is significant for asthma. There is no history of COPD.  Hyperlipidemia  This is a chronic problem. The current episode started more than 1 year ago. The problem is uncontrolled. Recent lipid tests were reviewed and are high. Exacerbating diseases include obesity. She has no history of diabetes. Pertinent negatives include no shortness of breath. Current antihyperlipidemic treatment includes diet change. The current treatment provides moderate improvement of lipids. Risk factors for coronary artery disease include dyslipidemia, obesity, post-menopausal and a sedentary lifestyle.  Insomnia  Primary symptoms: difficulty falling asleep, malaise/fatigue.  The current episode  started more than one year. The problem occurs intermittently. The problem has been waxing and waning since onset. How many beverages per day that contain caffeine: 0 - 1.       Review of Systems  Constitutional: Positive for fatigue, malaise/fatigue and weight gain.  HENT: Negative.  Negative for postnasal drip and sneezing.   Eyes: Negative.   Respiratory: Negative for cough, hemoptysis, sputum production, shortness of breath and wheezing.   Cardiovascular: Positive for dyspnea on exertion and palpitations.  Gastrointestinal: Positive for constipation. Negative for diarrhea.  Endocrine: Positive for cold intolerance.  Genitourinary: Negative.   Musculoskeletal: Negative.   Neurological: Negative.  Negative for headaches.  Hematological: Negative.   Psychiatric/Behavioral: The patient has insomnia. The patient is not nervous/anxious.   All other systems reviewed and are negative.      Objective:   Physical Exam  Constitutional: She is oriented to person, place, and time. She appears well-developed and well-nourished. No distress.  HENT:  Head: Normocephalic and atraumatic.  Right Ear: External ear normal.  Left Ear: External ear normal.  Nose: Nose normal.  Mouth/Throat: Oropharynx is clear and moist.  Eyes: Pupils are equal, round, and reactive to light.  Neck: Normal range of motion. Neck supple. No thyromegaly present.  Cardiovascular: Normal rate, regular rhythm, normal heart sounds and intact distal pulses.   No murmur heard. Pulmonary/Chest: Effort normal and breath sounds normal. No respiratory distress. She has no wheezes.  Abdominal: Soft. Bowel sounds are normal. She exhibits no distension. There is no tenderness.  Musculoskeletal: Normal range of motion. She exhibits no edema or tenderness.  Neurological: She is alert and oriented to person, place, and time.  Skin: Skin is warm and dry.  Psychiatric: She has a normal mood and affect. Her behavior is normal.  Judgment and thought content  normal.  Vitals reviewed.   BP 124/89   Pulse 76   Temp 97.4 F (36.3 C) (Oral)   Ht '5\' 3"'$  (1.6 m)   Wt 188 lb (85.3 kg)   LMP 02/04/2005   BMI 33.30 kg/m       Assessment & Plan:  1. Hypothyroidism, unspecified type - CMP14+EGFR - Thyroid Panel With TSH - Thyroid Peroxidase Antibody - levothyroxine (SYNTHROID, LEVOTHROID) 125 MCG tablet; Take 1 tablet (125 mcg total) by mouth daily.  Dispense: 90 tablet; Refill: 3  2. Class 1 obesity without serious comorbidity with body mass index (BMI) of 33.0 to 33.9 in adult, unspecified obesity type - CMP14+EGFR  3. Hyperlipidemia, unspecified hyperlipidemia type - CMP14+EGFR - Lipid panel  4. Insomnia, unspecified type - CMP14+EGFR  5. Vitamin D deficiency - CMP14+EGFR - VITAMIN D 25 Hydroxy (Vit-D Deficiency, Fractures)  6. Mild intermittent chronic asthma without complication - ZDG64+QIHK  7. Chronic allergic rhinitis due to pollen, unspecified seasonality - CMP14+EGFR  8. Annual physical exam - Anemia Profile B - CMP14+EGFR - Lipid panel - Thyroid Panel With TSH - VITAMIN D 25 Hydroxy (Vit-D Deficiency, Fractures)  9. Other fatigue - Anemia Profile B - CMP14+EGFR - Thyroid Panel With TSH - VITAMIN D 25 Hydroxy (Vit-D Deficiency, Fractures) - Cortisol - Iodine, Serum/Plasma - Selenium serum  10. Hormone replacement therapy (postmenopausal) - estradiol (VIVELLE-DOT) 0.05 MG/24HR patch; Place 1 patch (0.05 mg total) onto the skin 2 (two) times a week.  Dispense: 24 patch; Refill: 3  11. Post-menopausal  - DG WRFM DEXA   Continue all meds Labs pending Health Maintenance reviewed Diet and exercise encouraged RTO 6 months   Evelina Dun, FNP

## 2017-02-07 NOTE — Patient Instructions (Signed)
Hypothyroidism Hypothyroidism is a disorder of the thyroid. The thyroid is a large gland that is located in the lower front of the neck. The thyroid releases hormones that control how the body works. With hypothyroidism, the thyroid does not make enough of these hormones. What are the causes? Causes of hypothyroidism may include:  Viral infections.  Pregnancy.  Your own defense system (immune system) attacking your thyroid.  Certain medicines.  Birth defects.  Past radiation treatments to your head or neck.  Past treatment with radioactive iodine.  Past surgical removal of part or all of your thyroid.  Problems with the gland that is located in the center of your brain (pituitary).  What are the signs or symptoms? Signs and symptoms of hypothyroidism may include:  Feeling as though you have no energy (lethargy).  Inability to tolerate cold.  Weight gain that is not explained by a change in diet or exercise habits.  Dry skin.  Coarse hair.  Menstrual irregularity.  Slowing of thought processes.  Constipation.  Sadness or depression.  How is this diagnosed? Your health care provider may diagnose hypothyroidism with blood tests and ultrasound tests. How is this treated? Hypothyroidism is treated with medicine that replaces the hormones that your body does not make. After you begin treatment, it may take several weeks for symptoms to go away. Follow these instructions at home:  Take medicines only as directed by your health care provider.  If you start taking any new medicines, tell your health care provider.  Keep all follow-up visits as directed by your health care provider. This is important. As your condition improves, your dosage needs may change. You will need to have blood tests regularly so that your health care provider can watch your condition. Contact a health care provider if:  Your symptoms do not get better with treatment.  You are taking thyroid  replacement medicine and: ? You sweat excessively. ? You have tremors. ? You feel anxious. ? You lose weight rapidly. ? You cannot tolerate heat. ? You have emotional swings. ? You have diarrhea. ? You feel weak. Get help right away if:  You develop chest pain.  You develop an irregular heartbeat.  You develop a rapid heartbeat. This information is not intended to replace advice given to you by your health care provider. Make sure you discuss any questions you have with your health care provider. Document Released: 10/31/2005 Document Revised: 04/07/2016 Document Reviewed: 03/18/2014 Elsevier Interactive Patient Education  2017 Elsevier Inc.  

## 2017-02-08 ENCOUNTER — Other Ambulatory Visit (INDEPENDENT_AMBULATORY_CARE_PROVIDER_SITE_OTHER): Payer: BLUE CROSS/BLUE SHIELD

## 2017-02-08 DIAGNOSIS — E559 Vitamin D deficiency, unspecified: Secondary | ICD-10-CM | POA: Diagnosis not present

## 2017-02-08 DIAGNOSIS — E669 Obesity, unspecified: Secondary | ICD-10-CM | POA: Diagnosis not present

## 2017-02-08 DIAGNOSIS — E039 Hypothyroidism, unspecified: Secondary | ICD-10-CM | POA: Diagnosis not present

## 2017-02-08 DIAGNOSIS — Z0000 Encounter for general adult medical examination without abnormal findings: Secondary | ICD-10-CM | POA: Diagnosis not present

## 2017-02-08 DIAGNOSIS — G4700 Insomnia, unspecified: Secondary | ICD-10-CM | POA: Diagnosis not present

## 2017-02-08 DIAGNOSIS — R5383 Other fatigue: Secondary | ICD-10-CM | POA: Diagnosis not present

## 2017-02-08 DIAGNOSIS — E785 Hyperlipidemia, unspecified: Secondary | ICD-10-CM | POA: Diagnosis not present

## 2017-02-09 LAB — ANEMIA PROFILE B
BASOS ABS: 0 10*3/uL (ref 0.0–0.2)
Basos: 1 %
EOS (ABSOLUTE): 0.1 10*3/uL (ref 0.0–0.4)
Eos: 2 %
Ferritin: 100 ng/mL (ref 15–150)
Folate: 10.2 ng/mL (ref >3.0)
Hematocrit: 41.8 % (ref 34.0–46.6)
Hemoglobin: 13.9 g/dL (ref 11.1–15.9)
IMMATURE GRANS (ABS): 0 10*3/uL (ref 0.0–0.1)
Immature Granulocytes: 0 %
Iron Saturation: 31 % (ref 15–55)
Iron: 72 ug/dL (ref 27–159)
LYMPHS: 30 %
Lymphocytes Absolute: 1.3 10*3/uL (ref 0.7–3.1)
MCH: 27.1 pg (ref 26.6–33.0)
MCHC: 33.3 g/dL (ref 31.5–35.7)
MCV: 82 fL (ref 79–97)
Monocytes Absolute: 0.3 10*3/uL (ref 0.1–0.9)
Monocytes: 7 %
Neutrophils Absolute: 2.7 10*3/uL (ref 1.4–7.0)
Neutrophils: 60 %
PLATELETS: 273 10*3/uL (ref 150–379)
RBC: 5.13 x10E6/uL (ref 3.77–5.28)
RDW: 13.4 % (ref 12.3–15.4)
Retic Ct Pct: 1.4 % (ref 0.6–2.6)
Total Iron Binding Capacity: 233 ug/dL — ABNORMAL LOW (ref 250–450)
UIBC: 161 ug/dL (ref 131–425)
VITAMIN B 12: 457 pg/mL (ref 232–1245)
WBC: 4.4 10*3/uL (ref 3.4–10.8)

## 2017-02-09 LAB — CMP14+EGFR
ALT: 16 IU/L (ref 0–32)
AST: 17 IU/L (ref 0–40)
Albumin/Globulin Ratio: 1.8 (ref 1.2–2.2)
Albumin: 4.1 g/dL (ref 3.5–5.5)
Alkaline Phosphatase: 82 IU/L (ref 39–117)
BILIRUBIN TOTAL: 0.4 mg/dL (ref 0.0–1.2)
BUN/Creatinine Ratio: 15 (ref 9–23)
BUN: 13 mg/dL (ref 6–24)
CHLORIDE: 106 mmol/L (ref 96–106)
CO2: 24 mmol/L (ref 18–29)
Calcium: 9 mg/dL (ref 8.7–10.2)
Creatinine, Ser: 0.85 mg/dL (ref 0.57–1.00)
GFR calc non Af Amer: 85 mL/min/{1.73_m2} (ref >59)
GFR, EST AFRICAN AMERICAN: 98 mL/min/{1.73_m2} (ref >59)
GLUCOSE: 88 mg/dL (ref 65–99)
Globulin, Total: 2.3 g/dL (ref 1.5–4.5)
Potassium: 4.4 mmol/L (ref 3.5–5.2)
Sodium: 145 mmol/L — ABNORMAL HIGH (ref 134–144)
TOTAL PROTEIN: 6.4 g/dL (ref 6.0–8.5)

## 2017-02-09 LAB — LIPID PANEL
CHOLESTEROL TOTAL: 232 mg/dL — AB (ref 100–199)
Chol/HDL Ratio: 5 ratio units — ABNORMAL HIGH (ref 0.0–4.4)
HDL: 46 mg/dL (ref >39)
LDL Calculated: 159 mg/dL — ABNORMAL HIGH (ref 0–99)
TRIGLYCERIDES: 134 mg/dL (ref 0–149)
VLDL CHOLESTEROL CAL: 27 mg/dL (ref 5–40)

## 2017-02-09 LAB — THYROID PANEL WITH TSH
Free Thyroxine Index: 2.7 (ref 1.2–4.9)
T3 Uptake Ratio: 28 % (ref 24–39)
T4, Total: 9.6 ug/dL (ref 4.5–12.0)
TSH: 0.021 u[IU]/mL — AB (ref 0.450–4.500)

## 2017-02-09 LAB — CORTISOL: Cortisol: 8.5 ug/dL

## 2017-02-09 LAB — VITAMIN D 25 HYDROXY (VIT D DEFICIENCY, FRACTURES): Vit D, 25-Hydroxy: 29 ng/mL — ABNORMAL LOW (ref 30.0–100.0)

## 2017-02-09 LAB — IODINE, SERUM/PLASMA: Iodine: 63.1 ug/L (ref 40.0–92.0)

## 2017-02-09 LAB — THYROID PEROXIDASE ANTIBODY: THYROID PEROXIDASE ANTIBODY: 184 [IU]/mL — AB (ref 0–34)

## 2017-02-09 LAB — SELENIUM SERUM: Selenium, S/P: 131 ug/L (ref 79–326)

## 2017-02-13 ENCOUNTER — Other Ambulatory Visit: Payer: Self-pay | Admitting: Family

## 2017-02-13 MED ORDER — LEVOTHYROXINE SODIUM 112 MCG PO TABS: 112.0000 ug | ORAL_TABLET | Freq: Every day | ORAL | 1 refills | Status: DC

## 2017-02-13 MED ORDER — VITAMIN D (ERGOCALCIFEROL) 1.25 MG (50000 UNIT) PO CAPS: 50000.0000 [IU] | ORAL_CAPSULE | ORAL | 3 refills | Status: DC

## 2017-03-06 ENCOUNTER — Telehealth: Payer: Self-pay | Admitting: Family

## 2017-03-06 NOTE — Telephone Encounter (Signed)
Pt aware to stop Rx Vit D She will try OTC Vit d 5,000 iu If this does not agree she will go down to 2,000 then 1,000 She will add magnesium

## 2017-03-06 NOTE — Telephone Encounter (Signed)
Since starting Vit D up to 4 days after diarrhea Constant pain after taking low back pain feels like bone pain, heel pain unable to walk on. Very stiff unable to get up after sitting for a few minutes. Usually takes every Wednesdays. Suggested Tylenol to help with pain / discomfort for now, no Aleve or Ibuprofen

## 2017-03-06 NOTE — Telephone Encounter (Signed)
Pt can stop Vit D

## 2017-03-14 ENCOUNTER — Other Ambulatory Visit: Payer: Self-pay | Admitting: *Deleted

## 2017-03-14 ENCOUNTER — Telehealth: Payer: Self-pay | Admitting: Family

## 2017-03-14 MED ORDER — SYNTHROID 112 MCG PO TABS: 112.0000 ug | ORAL_TABLET | Freq: Every day | ORAL | 11 refills | Status: DC

## 2017-03-14 NOTE — Progress Notes (Signed)
Pt called in - she asked for her thyroid med to be the new dose (112) and she wants it to be NAME brand.  Sent and aware

## 2017-04-05 NOTE — Telephone Encounter (Signed)
Script was done

## 2017-10-16 ENCOUNTER — Encounter: Payer: Self-pay | Admitting: Family

## 2017-10-16 ENCOUNTER — Ambulatory Visit (INDEPENDENT_AMBULATORY_CARE_PROVIDER_SITE_OTHER): Payer: BLUE CROSS/BLUE SHIELD

## 2017-10-16 ENCOUNTER — Ambulatory Visit: Payer: BLUE CROSS/BLUE SHIELD | Admitting: Family

## 2017-10-16 VITALS — BP 130/92 | HR 86 | Ht 63.0 in | Wt 196.0 lb

## 2017-10-16 DIAGNOSIS — E669 Obesity, unspecified: Secondary | ICD-10-CM

## 2017-10-16 DIAGNOSIS — Z6833 Body mass index (BMI) 33.0-33.9, adult: Secondary | ICD-10-CM | POA: Diagnosis not present

## 2017-10-16 DIAGNOSIS — F411 Generalized anxiety disorder: Secondary | ICD-10-CM | POA: Diagnosis not present

## 2017-10-16 DIAGNOSIS — I519 Heart disease, unspecified: Secondary | ICD-10-CM

## 2017-10-16 DIAGNOSIS — J4520 Mild intermittent asthma, uncomplicated: Secondary | ICD-10-CM | POA: Diagnosis not present

## 2017-10-16 DIAGNOSIS — E785 Hyperlipidemia, unspecified: Secondary | ICD-10-CM | POA: Diagnosis not present

## 2017-10-16 DIAGNOSIS — J301 Allergic rhinitis due to pollen: Secondary | ICD-10-CM

## 2017-10-16 DIAGNOSIS — E039 Hypothyroidism, unspecified: Secondary | ICD-10-CM

## 2017-10-16 DIAGNOSIS — M79672 Pain in left foot: Secondary | ICD-10-CM

## 2017-10-16 MED ORDER — NAPROXEN 500 MG PO TABS: 500.0000 mg | ORAL_TABLET | Freq: Two times a day (BID) | ORAL | 1 refills | Status: DC

## 2017-10-16 MED ORDER — BUSPIRONE HCL 10 MG PO TABS: 10.0000 mg | ORAL_TABLET | Freq: Three times a day (TID) | ORAL | 1 refills | Status: DC

## 2017-10-16 NOTE — Patient Instructions (Signed)

## 2017-10-16 NOTE — Progress Notes (Signed)
Subjective:    Patient ID: Tonya Hoffman, female    DOB: 12/28/1975, 41 y.o.   MRN: [id number]  Pt presents to the office today for chronic problems. Pt currently using estradiol patches  For hot flashes since her  hysterectomy in 2008.   Pt complaining of left heel pain that started 7 months ago. PT states she has constant pain of 8 out 10.  Thyroid Problem  Presents for follow-up visit. Symptoms include anxiety, constipation, diarrhea, dry skin, fatigue and hoarse voice. Patient reports no hair loss. The symptoms have been stable. Her past medical history is significant for hyperlipidemia.  Asthma  She complains of cough and hoarse voice. There is no wheezing. This is a new problem. The problem has been waxing and waning. The cough is non-productive. Associated symptoms include ear pain (left), nasal congestion, rhinorrhea and sneezing. Pertinent negatives include no ear congestion. Her past medical history is significant for asthma.  Hyperlipidemia  This is a chronic problem. The current episode started more than 1 year ago. The problem is uncontrolled. Recent lipid tests were reviewed and are high. Exacerbating diseases include obesity. Current antihyperlipidemic treatment includes diet change. The current treatment provides no improvement of lipids.  Anxiety  Presents for initial visit. Onset was 1 to 5 years ago. The problem has been waxing and waning. Symptoms include excessive worry, irritability, nervous/anxious behavior and restlessness. Symptoms occur occasionally. The severity of symptoms is moderate. The symptoms are aggravated by family issues.   Her past medical history is significant for asthma. Compliance with prior treatments has been good.      Review of Systems  Constitutional: Positive for fatigue and irritability.  HENT: Positive for ear pain (left), hoarse voice, rhinorrhea and sneezing.   Respiratory: Positive for cough. Negative for wheezing.   Gastrointestinal:  Positive for constipation and diarrhea.  Psychiatric/Behavioral: The patient is nervous/anxious.   All other systems reviewed and are negative.      Objective:   Physical Exam  Constitutional: She is oriented to person, place, and time. She appears well-developed and well-nourished. No distress.  HENT:  Head: Normocephalic and atraumatic.  Right Ear: External ear normal. Tympanic membrane is bulging.  Left Ear: External ear normal. Tympanic membrane is bulging.  Nose: Nose normal.  Mouth/Throat: Posterior oropharyngeal erythema present.  Eyes: Pupils are equal, round, and reactive to light.  Neck: Normal range of motion. Neck supple. No thyromegaly present.  Cardiovascular: Normal rate, regular rhythm, normal heart sounds and intact distal pulses.  No murmur heard. Pulmonary/Chest: Effort normal and breath sounds normal. No respiratory distress. She has no wheezes.  Abdominal: Soft. Bowel sounds are normal. She exhibits no distension. There is no tenderness.  Musculoskeletal: Normal range of motion. She exhibits tenderness (left heel). She exhibits no edema.  Neurological: She is alert and oriented to person, place, and time.  Skin: Skin is warm and dry.  Psychiatric: She has a normal mood and affect. Her behavior is normal. Judgment and thought content normal.  Vitals reviewed.   BP (!) 130/92   Pulse 86   Ht '5\' 3"'$  (1.6 m)   Wt 196 lb (88.9 kg)   LMP 02/04/2005   BMI 34.72 kg/m      Assessment & Plan:  1. Mild intermittent chronic asthma without complication - [id number]+DZHG; Future  2. Allergic rhinitis due to pollen, unspecified seasonality - CMP14+EGFR; Future  3. Hypothyroidism, unspecified type - CMP14+EGFR; Future - Thyroid peroxidase antibody - Thyroid Panel With TSH  4. Hyperlipidemia,  unspecified hyperlipidemia type - CMP14+EGFR; Future - Lipid panel; Future  5. Class 1 obesity without serious comorbidity with body mass index (BMI) of 33.0 to 33.9 in adult,  unspecified obesity type - CMP14+EGFR; Future  6. GAD (generalized anxiety disorder) Will start pt on Buspar today Does not want something she takes daily, states she has a "bad" reaction with lexapro and was sucidial and is scared of medication  - busPIRone (BUSPAR) 10 MG tablet; Take 1 tablet (10 mg total) by mouth 3 (three) times daily.  Dispense: 90 tablet; Refill: 1  7. Pain of left heel Take naprosyn BID  Rest Frozen water bottle heel to toe 3-4 times a day  - DG Foot Complete Left; Future - naproxen (NAPROSYN) 500 MG tablet; Take 1 tablet (500 mg total) by mouth 2 (two) times daily with a meal.  Dispense: 60 tablet; Refill: 1   Continue all meds Labs pending Health Maintenance reviewed Diet and exercise encouraged RTO 3 months   Evelina Dun, FNP

## 2017-10-17 NOTE — Addendum Note (Signed)
Addended by: Lorelee CoverOSTOSKY, Harumi Yamin C on: 10/17/2017 03:46 PM   Modules accepted: Orders

## 2017-10-18 ENCOUNTER — Other Ambulatory Visit: Payer: BLUE CROSS/BLUE SHIELD

## 2017-10-18 DIAGNOSIS — E039 Hypothyroidism, unspecified: Secondary | ICD-10-CM

## 2017-10-18 DIAGNOSIS — Z6833 Body mass index (BMI) 33.0-33.9, adult: Secondary | ICD-10-CM

## 2017-10-18 DIAGNOSIS — J301 Allergic rhinitis due to pollen: Secondary | ICD-10-CM

## 2017-10-18 DIAGNOSIS — J4520 Mild intermittent asthma, uncomplicated: Secondary | ICD-10-CM | POA: Diagnosis not present

## 2017-10-18 DIAGNOSIS — E669 Obesity, unspecified: Secondary | ICD-10-CM

## 2017-10-18 DIAGNOSIS — E785 Hyperlipidemia, unspecified: Secondary | ICD-10-CM

## 2017-10-18 LAB — CMP14+EGFR
A/G RATIO: 2 (ref 1.2–2.2)
ALT: 13 IU/L (ref 0–32)
AST: 13 IU/L (ref 0–40)
Albumin: 4.4 g/dL (ref 3.5–5.5)
Alkaline Phosphatase: 87 IU/L (ref 39–117)
BILIRUBIN TOTAL: 0.4 mg/dL (ref 0.0–1.2)
BUN/Creatinine Ratio: 10 (ref 9–23)
BUN: 10 mg/dL (ref 6–24)
CHLORIDE: 105 mmol/L (ref 96–106)
CO2: 24 mmol/L (ref 20–29)
Calcium: 9.5 mg/dL (ref 8.7–10.2)
Creatinine, Ser: 0.98 mg/dL (ref 0.57–1.00)
GFR calc Af Amer: 82 mL/min/{1.73_m2} (ref >59)
GFR calc non Af Amer: 71 mL/min/{1.73_m2} (ref >59)
Globulin, Total: 2.2 g/dL (ref 1.5–4.5)
Glucose: 87 mg/dL (ref 65–99)
POTASSIUM: 4.5 mmol/L (ref 3.5–5.2)
Sodium: 143 mmol/L (ref 134–144)
Total Protein: 6.6 g/dL (ref 6.0–8.5)

## 2017-10-18 LAB — LIPID PANEL
CHOLESTEROL TOTAL: 225 mg/dL — AB (ref 100–199)
Chol/HDL Ratio: 5.4 ratio — ABNORMAL HIGH (ref 0.0–4.4)
HDL: 42 mg/dL (ref >39)
LDL Calculated: 156 mg/dL — ABNORMAL HIGH (ref 0–99)
TRIGLYCERIDES: 136 mg/dL (ref 0–149)
VLDL Cholesterol Cal: 27 mg/dL (ref 5–40)

## 2017-10-19 ENCOUNTER — Other Ambulatory Visit: Payer: Self-pay | Admitting: Family

## 2017-10-20 ENCOUNTER — Telehealth: Payer: Self-pay | Admitting: Family

## 2017-10-20 LAB — SPECIMEN STATUS REPORT

## 2017-10-20 LAB — THYROID PEROXIDASE ANTIBODY: Thyroperoxidase Ab SerPl-aCnc: 144 IU/mL — ABNORMAL HIGH (ref 0–34)

## 2017-10-20 LAB — THYROID PANEL WITH TSH
Free Thyroxine Index: 2.3 (ref 1.2–4.9)
T3 UPTAKE RATIO: 27 % (ref 24–39)
T4, Total: 8.7 ug/dL (ref 4.5–12.0)
TSH: 1.57 u[IU]/mL (ref 0.450–4.500)

## 2017-10-20 NOTE — Telephone Encounter (Signed)
Patient advised that labs were added yesterday

## 2017-10-26 ENCOUNTER — Other Ambulatory Visit: Payer: Self-pay | Admitting: Family

## 2017-10-26 DIAGNOSIS — E039 Hypothyroidism, unspecified: Secondary | ICD-10-CM

## 2017-12-12 ENCOUNTER — Ambulatory Visit: Payer: BLUE CROSS/BLUE SHIELD | Admitting: Endocrinology

## 2017-12-13 ENCOUNTER — Telehealth: Payer: Self-pay | Admitting: Family

## 2017-12-13 DIAGNOSIS — Z1211 Encounter for screening for malignant neoplasm of colon: Secondary | ICD-10-CM

## 2017-12-13 DIAGNOSIS — N63 Unspecified lump in unspecified breast: Secondary | ICD-10-CM

## 2017-12-13 NOTE — Telephone Encounter (Signed)
Pt was made an appt on [employer] [facility]. When asked if had any breast problems she has stated she has found some lumps in right  breast no pain and denies nipple discharge and discoloration. Can you put in order for diagnostic  and pt would also like order for colonoscopy. Please advise

## 2017-12-14 NOTE — Telephone Encounter (Signed)
Referral for diagnostic mammogram ordered and GI referral placed.

## 2017-12-25 ENCOUNTER — Encounter (INDEPENDENT_AMBULATORY_CARE_PROVIDER_SITE_OTHER): Payer: Self-pay | Admitting: *Deleted

## 2017-12-27 ENCOUNTER — Telehealth: Payer: Self-pay

## 2017-12-27 NOTE — Telephone Encounter (Signed)
I ordered both of these on 12/14/17 can we please follow up on these?

## 2017-12-27 NOTE — Telephone Encounter (Signed)
Wants to get referral for Colonoscopy and DX mammogram because she has found a lump

## 2017-12-29 NOTE — Telephone Encounter (Signed)
x

## 2017-12-30 ENCOUNTER — Other Ambulatory Visit: Payer: Self-pay | Admitting: Family

## 2017-12-30 DIAGNOSIS — M79672 Pain in left foot: Secondary | ICD-10-CM

## 2018-01-02 ENCOUNTER — Other Ambulatory Visit: Payer: Self-pay | Admitting: Family

## 2018-01-02 DIAGNOSIS — M79672 Pain in left foot: Secondary | ICD-10-CM

## 2018-01-03 ENCOUNTER — Other Ambulatory Visit: Payer: Self-pay | Admitting: Family

## 2018-01-03 DIAGNOSIS — M79672 Pain in left foot: Secondary | ICD-10-CM

## 2018-01-12 ENCOUNTER — Other Ambulatory Visit: Payer: Self-pay | Admitting: Family

## 2018-01-12 DIAGNOSIS — N6310 Unspecified lump in the right breast, unspecified quadrant: Secondary | ICD-10-CM

## 2018-01-22 ENCOUNTER — Ambulatory Visit (INDEPENDENT_AMBULATORY_CARE_PROVIDER_SITE_OTHER): Payer: BLUE CROSS/BLUE SHIELD | Admitting: Internal Medicine

## 2018-01-22 ENCOUNTER — Encounter (INDEPENDENT_AMBULATORY_CARE_PROVIDER_SITE_OTHER): Payer: Self-pay | Admitting: Internal Medicine

## 2018-01-22 VITALS — BP 150/84 | HR 68 | Temp 98.0°F | Ht 63.0 in | Wt 201.3 lb

## 2018-01-22 DIAGNOSIS — Z800 Family history of malignant neoplasm of digestive organs: Secondary | ICD-10-CM | POA: Diagnosis not present

## 2018-01-22 DIAGNOSIS — H8109 Meniere's disease, unspecified ear: Secondary | ICD-10-CM

## 2018-01-22 DIAGNOSIS — R194 Change in bowel habit: Secondary | ICD-10-CM | POA: Diagnosis not present

## 2018-01-22 HISTORY — DX: Meniere's disease, unspecified ear: H81.09

## 2018-01-22 NOTE — Progress Notes (Signed)
Subjective:    Patient ID: Tonya Hoffman, female    DOB: 01-04-1976, 42 y.o.   MRN: [id number]  HPIReferred by Thereasa Distancehristy Hawkins, FNP for colonoscopy. She presents today digestive issues. She has food allergies. She says she stays constipated but does alternates between diarrhea. She has gained about 25 pounds in the past 5 months.  She says sometimes she has hardness in her epigastric region. She does occasionally have some abdominal pain.  She has had symptoms of constipation for several years.  Her appetite is good. She has gained 25 pounds over the 5 months.  She says sometimes she is not hungry sometimes or sometimes she will be hungry and will eat. Grandfather had colon cancer in his 560s when he was diagnosed.    She denies any blood in her stools. She usual has 1-2 stools a day. She does have frequent indigestion.  She denies any pain when she has a BM.   Diagnosed with Hypothyroid in 2008  CBC    Component Value Date/Time   WBC 4.4 02/08/2017 1232   RBC 5.13 02/08/2017 1232   HGB 13.9 02/08/2017 1232   HCT 41.8 02/08/2017 1232   PLT 273 02/08/2017 1232   MCV 82 02/08/2017 1232   MCH 27.1 02/08/2017 1232   MCHC 33.3 02/08/2017 1232   RDW 13.4 02/08/2017 1232   LYMPHSABS 1.3 02/08/2017 1232   EOSABS 0.1 02/08/2017 1232   BASOSABS 0.0 02/08/2017 1232     Review of Systems Past Medical History:  Diagnosis Date  . Hyperlipidemia   . Meniere disease 01/22/2018  . Thyroid disease     Past Surgical History:  Procedure Laterality Date  . ABDOMINAL HYSTERECTOMY    . CESAREAN SECTION    . laporascopy      Allergies  Allergen Reactions  . Doxycycline     Head was throbbing. Couldn't thinks and extremely weak.   . Statins     Body aches that were worse in her legs.    Current Outpatient Medications on File Prior to Visit  Medication Sig Dispense Refill  . Cholecalciferol (VITAMIN D3) 5000 units TABS Take 5,000 Units by mouth 2 (two) times daily.    Marland Kitchen. estradiol  (VIVELLE-DOT) 0.05 MG/24HR patch Place 1 patch (0.05 mg total) onto the skin 2 (two) times a week. 24 patch 3  . Magnesium Citrate 100 MG TABS Take by mouth.    . naproxen (NAPROSYN) 500 MG tablet TAKE 1 TABLET BY MOUTH TWICE DAILY WITH  A  MEAL 60 tablet 0  . Potassium (GNP POTASSIUM) 99 MG TABS Take by mouth.    . SYNTHROID 112 MCG tablet Take 1 tablet (112 mcg total) by mouth daily before breakfast. 30 tablet 11  . vitamin B-12 (CYANOCOBALAMIN) 500 MCG tablet Take 500 mcg by mouth daily.     No current facility-administered medications on file prior to visit.         Objective:   Physical Exam Blood pressure (!) 150/84, pulse 68, temperature 98 F (36.7 C), height 5\' 3"  (1.6 m), weight 201 lb 4.8 oz (91.3 kg), last menstrual period 02/04/2005.  Alert and oriented. Skin warm and dry. Oral mucosa is moist.   . Sclera anicteric, conjunctivae is pink. Thyroid not enlarged. No cervical lymphadenopathy. Lungs clear. Heart regular rate and rhythm.  Abdomen is soft. Bowel sounds are positive. No hepatomegaly. No abdominal masses felt. No tenderness.  No edema to lower extremities.         Assessment &  Plan:  Family hx of colon cancer. Needs surveillance colonoscopy. Probable IBS: Try taking Miralax daily or a stool softener. Continue the Probiotic.

## 2018-01-22 NOTE — Patient Instructions (Signed)
The risks of bleeding, perforation and infection were reviewed with patient.  

## 2018-01-23 ENCOUNTER — Ambulatory Visit: Payer: BLUE CROSS/BLUE SHIELD | Admitting: Endocrinology

## 2018-01-23 ENCOUNTER — Encounter: Payer: Self-pay | Admitting: Endocrinology

## 2018-01-23 VITALS — BP 130/84 | HR 69 | Resp 16 | Wt 203.0 lb

## 2018-01-23 DIAGNOSIS — R635 Abnormal weight gain: Secondary | ICD-10-CM | POA: Diagnosis not present

## 2018-01-23 DIAGNOSIS — R5383 Other fatigue: Secondary | ICD-10-CM

## 2018-01-23 DIAGNOSIS — E063 Autoimmune thyroiditis: Secondary | ICD-10-CM

## 2018-01-23 LAB — T3, FREE: T3, Free: 3.1 pg/mL (ref 2.3–4.2)

## 2018-01-23 LAB — TSH: TSH: 1.28 u[IU]/mL (ref 0.35–4.50)

## 2018-01-23 LAB — T4, FREE: FREE T4: 1.09 ng/dL (ref 0.60–1.60)

## 2018-01-23 MED ORDER — LEVOTHYROXINE SODIUM 88 MCG PO TABS: 88.0000 ug | ORAL_TABLET | Freq: Every day | ORAL | 3 refills | Status: DC

## 2018-01-23 MED ORDER — LIOTHYRONINE SODIUM 5 MCG PO TABS: 5.0000 ug | ORAL_TABLET | Freq: Every day | ORAL | 1 refills | Status: DC

## 2018-01-23 NOTE — Progress Notes (Signed)
Patient ID: Tonya Hoffman, female   DOB: 10-Nov-1976, 42 y.o.   MRN: [id number]           Referring Physician: Dayton Bailiff  Reason for Appointment:  Hypothyroidism, new visit    History of Present Illness:   Hypothyroidism was first diagnosed in ?  2010  At the time of diagnosis patient was having symptoms of  fatigue, difficulty concentrating, weight gain and possibly mild depression           After several months she was finally diagnosed to have hypothyroidism and initially tried on 100 mcg of levothyroxine With this she started having palpitations and the dose was reduced  The patient has been treated with various doses of levothyroxine over the last several years    With starting thyroid supplementation the patient's symptoms did improve but not resolved completely She is being followed periodically by her PCP  Until 3/17 she was taking 75 mcg of levothyroxine for a few years, at that time because of high TSH her dose was increased up to 125 mcg However because of a low TSH a year later her dose was reduced down to 112 mcg  Over the last 6 months or so she thinks that she has had somewhat more fatigue and tendency to weight gain along with difficulty with mental function Over the last 2 years she has probably gained about 20 pounds She also continues to have insomnia and daytime somnolence Does not complain of any new cold intolerance, she tends to have cold sensitivity anyway her last TSH was in 12/18  The patient says that she is wanting to change her medication to a more natural type of thyroid preparation Also she is concerned about her having generalized aching  THYROID enlargement: She has been told to have a thyroid enlargement for several years and has been followed periodically with ultrasound exams that showed nodules but no details available She thinks the left side of her thyroid is getting larger.  She is not having any local pressure or difficulty swallowing She  reportedly had an ultrasound in 2017       Patient's weight history is as follows:  Wt Readings from Last 3 Encounters:  01/23/18 203 lb (92.1 kg)  01/22/18 201 lb 4.8 oz (91.3 kg)  10/16/17 196 lb (88.9 kg)    Thyroid function results have been as follows:  Lab Results  Component Value Date   TSH 1.570 10/18/2017   TSH 0.021 (L) 02/08/2017   TSH 17.850 (H) 02/01/2016   TSH 3.370 12/09/2014     Past Medical History:  Diagnosis Date  . Hyperlipidemia   . Meniere disease 01/22/2018  . Thyroid disease     Past Surgical History:  Procedure Laterality Date  . ABDOMINAL HYSTERECTOMY    . CESAREAN SECTION    . laporascopy      Family History  Problem Relation Age of Onset  . Hypertension Mother   . Heart disease Father   . Hypertension Brother     Social History:  reports that  has never smoked. she has never used smokeless tobacco. She reports that she does not drink alcohol or use drugs.  Allergies:  Allergies  Allergen Reactions  . Doxycycline     Head was throbbing. Couldn't thinks and extremely weak.   . Statins     Body aches that were worse in her legs.    Allergies as of 01/23/2018      Reactions   Doxycycline  Head was throbbing. Couldn't thinks and extremely weak.    Statins    Body aches that were worse in her legs.      Medication List        Accurate as of 01/23/18 10:32 AM. Always use your most recent med list.          estradiol 0.05 MG/24HR patch Commonly known as:  VIVELLE-DOT Place 1 patch (0.05 mg total) onto the skin 2 (two) times a week.   GNP POTASSIUM 99 MG Tabs Generic drug:  Potassium Take by mouth.   Magnesium Citrate 100 MG Tabs Take 40 mg by mouth.   naproxen 500 MG tablet Commonly known as:  NAPROSYN TAKE 1 TABLET BY MOUTH TWICE DAILY WITH  A  MEAL   PROBIOTIC-10 Caps Take by mouth.   SYNTHROID 112 MCG tablet Generic drug:  levothyroxine Take 1 tablet (112 mcg total) by mouth daily before breakfast.     vitamin B-12 500 MCG tablet Commonly known as:  CYANOCOBALAMIN Take 500 mcg by mouth daily.   Vitamin D3 5000 units Tabs Take 5,000 Units by mouth 2 (two) times daily.          Review of Systems  Constitutional: Positive for weight gain.  HENT: Negative for trouble swallowing.   Respiratory: Positive for daytime sleepiness.        Reportedly has had a normal sleep study in the past  Gastrointestinal: Positive for constipation.       Has chronic constipation usually  Endocrine: Positive for fatigue and cold intolerance.       She has some hot flashes despite taking HRT  Musculoskeletal: Positive for joint pain.  Skin: Negative for dry skin.  Neurological: Negative for numbness and tingling.  Psychiatric/Behavioral: Positive for insomnia.Negative for depressed mood.       She has difficulty falling asleep until about 2 AM                Examination:    BP 130/84 (BP Location: Left Arm, Patient Position: Sitting, Cuff Size: Normal)   Pulse 69   Resp 16   Wt 203 lb (92.1 kg)   LMP 02/04/2005   SpO2 98%   BMI 35.96 kg/m   GENERAL:  She has mild generalized obesity present.   No pallor, clubbing, lymphadenopathy or edema.  Skin:  no rash or pigmentation.  Not showing signs of dryness  EYES:  No prominence of the eyes or swelling of the eyelids  ENT: Oral mucosa and tongue normal.  THYROID: She has an enlarged left lobe of the thyroid, firm about twice normal, about 2.5 cm across; right lobe is not palpable.  HEART:  Normal  S1 and S2; no murmur or click.  CHEST:    Lungs: Vescicular breath sounds heard equally.  No crepitations/ wheeze.  ABDOMEN:  No distention.  Liver and spleen not palpable.  No other mass or tenderness.  NEUROLOGICAL: Reflexes are bilaterally normal at biceps and ankles.  JOINTS:  Normal peripheral joints.   Assessment:  HYPOTHYROIDISM, primary associated with small goiter  She has nonspecific symptoms of progressive weight gain,  persistent fatigue, somnolence and difficulty with concentration that may or may not be related to her hypothyroidism.  She is not reporting improvement in symptoms back to normal with levothyroxine supplementation Unlikely that her weight gain is related to hypothyroidism which has been generally adequately treated except in 2017 when her TSH was high  Last TSH in December was normal taking 112 mcg  of levothyroxine She is also very compliant with taking this daily  Reportedly has multinodular goiter but no reports of previous ultrasound available  Generalized aching: Likely has fibromyalgia that has not been addressed, will defer to PCP.  Discussed that this is not related to hypothyroidism especially since it is associated with sleep difficulties  PLAN:   Check free T4, free T3 and TSH today If her free T3 is at the low end of normal she may benefit from a combination of levothyroxine and liothyronine Discussed that it is easier to titrate the 2 separate components rather than having an animal source preparation such as Armour Thyroid   Also would benefit from consultation with rheumatologist for her myalgia/arthralgia She is agreeable to seeing a dietitian for her weight gain and food sensitivities Recommend starting exercise when able to for weight loss   Reather Littler 01/23/2018, 10:32 AM   Consultation note copy sent to the PCP  Note: This office note was prepared with Dragon voice recognition system technology. Any transcriptional errors that result from this process are unintentional.

## 2018-01-26 DIAGNOSIS — Z800 Family history of malignant neoplasm of digestive organs: Secondary | ICD-10-CM | POA: Insufficient documentation

## 2018-02-12 ENCOUNTER — Telehealth (INDEPENDENT_AMBULATORY_CARE_PROVIDER_SITE_OTHER): Payer: Self-pay | Admitting: *Deleted

## 2018-02-12 ENCOUNTER — Encounter (INDEPENDENT_AMBULATORY_CARE_PROVIDER_SITE_OTHER): Payer: Self-pay | Admitting: *Deleted

## 2018-02-12 MED ORDER — PEG 3350-KCL-NA BICARB-NACL 420 G PO SOLR: 4000.0000 mL | Freq: Once | ORAL | 0 refills | Status: AC

## 2018-02-12 NOTE — Telephone Encounter (Signed)
Patient needs trilyte 

## 2018-02-14 ENCOUNTER — Other Ambulatory Visit: Payer: Self-pay | Admitting: *Deleted

## 2018-02-14 ENCOUNTER — Telehealth: Payer: Self-pay | Admitting: Family

## 2018-02-14 DIAGNOSIS — M79672 Pain in left foot: Secondary | ICD-10-CM

## 2018-02-14 NOTE — Telephone Encounter (Signed)
Go ahead and refer to podiatry

## 2018-02-14 NOTE — Telephone Encounter (Signed)
Pain for over six months. Medications not helping. Was seen by her provider and was advised may need injections.  Please advise on referral to ortho or podiatry.

## 2018-02-14 NOTE — Telephone Encounter (Signed)
Referral for podiatry ordered.

## 2018-02-15 ENCOUNTER — Ambulatory Visit: Payer: BLUE CROSS/BLUE SHIELD | Admitting: Family

## 2018-02-22 ENCOUNTER — Ambulatory Visit
Admission: RE | Admit: 2018-02-22 | Discharge: 2018-02-22 | Disposition: A | Discharge status: Home / Self Care | Payer: BLUE CROSS/BLUE SHIELD | Source: Ambulatory Visit | Attending: Family | Admitting: Family

## 2018-02-22 ENCOUNTER — Other Ambulatory Visit: Payer: Self-pay | Admitting: Family

## 2018-02-22 DIAGNOSIS — N63 Unspecified lump in unspecified breast: Secondary | ICD-10-CM

## 2018-02-22 DIAGNOSIS — N6489 Other specified disorders of breast: Secondary | ICD-10-CM | POA: Diagnosis not present

## 2018-02-22 DIAGNOSIS — R928 Other abnormal and inconclusive findings on diagnostic imaging of breast: Secondary | ICD-10-CM | POA: Diagnosis not present

## 2018-02-28 ENCOUNTER — Telehealth (INDEPENDENT_AMBULATORY_CARE_PROVIDER_SITE_OTHER): Payer: Self-pay | Admitting: *Deleted

## 2018-02-28 NOTE — Telephone Encounter (Signed)
Patient called needing to cancel TCS sch'd 03/15/18 -- she states insurancewill not pay for it

## 2018-02-28 NOTE — Telephone Encounter (Signed)
noted 

## 2018-03-06 DIAGNOSIS — M79672 Pain in left foot: Secondary | ICD-10-CM | POA: Diagnosis not present

## 2018-03-06 DIAGNOSIS — M722 Plantar fascial fibromatosis: Secondary | ICD-10-CM | POA: Diagnosis not present

## 2018-03-09 ENCOUNTER — Other Ambulatory Visit: Payer: Self-pay | Admitting: Family

## 2018-03-09 DIAGNOSIS — Z79890 Hormone replacement therapy (postmenopausal): Secondary | ICD-10-CM

## 2018-03-15 ENCOUNTER — Ambulatory Visit (HOSPITAL_COMMUNITY)
Admission: RE | Admit: 2018-03-15 | Payer: BLUE CROSS/BLUE SHIELD | Source: Ambulatory Visit | Admitting: Internal Medicine

## 2018-03-15 ENCOUNTER — Other Ambulatory Visit: Payer: Self-pay | Admitting: Family

## 2018-03-15 ENCOUNTER — Encounter (HOSPITAL_COMMUNITY): Admission: RE | Payer: Self-pay | Source: Ambulatory Visit

## 2018-03-15 DIAGNOSIS — Z79890 Hormone replacement therapy (postmenopausal): Secondary | ICD-10-CM

## 2018-03-15 SURGERY — COLONOSCOPY
Anesthesia: Moderate Sedation

## 2018-03-27 DIAGNOSIS — M722 Plantar fascial fibromatosis: Secondary | ICD-10-CM | POA: Diagnosis not present

## 2018-04-25 ENCOUNTER — Ambulatory Visit: Payer: BLUE CROSS/BLUE SHIELD | Admitting: Endocrinology

## 2018-06-25 ENCOUNTER — Other Ambulatory Visit: Payer: Self-pay | Admitting: Endocrinology

## 2018-07-27 ENCOUNTER — Other Ambulatory Visit: Payer: Self-pay

## 2018-07-27 ENCOUNTER — Emergency Department (HOSPITAL_COMMUNITY): Payer: BLUE CROSS/BLUE SHIELD

## 2018-07-27 ENCOUNTER — Emergency Department (HOSPITAL_COMMUNITY)
Admission: EM | Admit: 2018-07-27 | Discharge: 2018-07-27 | Disposition: A | Discharge status: Home / Self Care | Payer: BLUE CROSS/BLUE SHIELD | Attending: Emergency Medicine | Admitting: Emergency Medicine

## 2018-07-27 ENCOUNTER — Encounter (HOSPITAL_COMMUNITY): Payer: Self-pay | Admitting: *Deleted

## 2018-07-27 DIAGNOSIS — Z79899 Other long term (current) drug therapy: Secondary | ICD-10-CM | POA: Diagnosis not present

## 2018-07-27 DIAGNOSIS — E039 Hypothyroidism, unspecified: Secondary | ICD-10-CM | POA: Insufficient documentation

## 2018-07-27 DIAGNOSIS — J069 Acute upper respiratory infection, unspecified: Secondary | ICD-10-CM | POA: Insufficient documentation

## 2018-07-27 DIAGNOSIS — R05 Cough: Secondary | ICD-10-CM | POA: Diagnosis present

## 2018-07-27 DIAGNOSIS — R079 Chest pain, unspecified: Secondary | ICD-10-CM | POA: Diagnosis not present

## 2018-07-27 DIAGNOSIS — R0789 Other chest pain: Secondary | ICD-10-CM | POA: Diagnosis not present

## 2018-07-27 LAB — BASIC METABOLIC PANEL
ANION GAP: 6 (ref 5–15)
BUN: 13 mg/dL (ref 6–20)
CHLORIDE: 106 mmol/L (ref 98–111)
CO2: 26 mmol/L (ref 22–32)
Calcium: 9.2 mg/dL (ref 8.9–10.3)
Creatinine, Ser: 1.1 mg/dL — ABNORMAL HIGH (ref 0.44–1.00)
GFR calc Af Amer: >60 mL/min (ref >60)
GLUCOSE: 84 mg/dL (ref 70–99)
POTASSIUM: 4 mmol/L (ref 3.5–5.1)
Sodium: 138 mmol/L (ref 135–145)

## 2018-07-27 LAB — CBC
HCT: 43.6 % (ref 36.0–46.0)
Hemoglobin: 14.3 g/dL (ref 12.0–15.0)
MCH: 28.5 pg (ref 26.0–34.0)
MCHC: 32.8 g/dL (ref 30.0–36.0)
MCV: 86.9 fL (ref 78.0–100.0)
Platelets: 192 10*3/uL (ref 150–400)
RBC: 5.02 MIL/uL (ref 3.87–5.11)
RDW: 13.8 % (ref 11.5–15.5)
WBC: 6.5 10*3/uL (ref 4.0–10.5)

## 2018-07-27 LAB — D-DIMER, QUANTITATIVE (NOT AT ARMC)

## 2018-07-27 LAB — TROPONIN I: Troponin I: <0.03 ng/mL (ref <0.03)

## 2018-07-27 MED ORDER — AZITHROMYCIN 250 MG PO TABS: 250.0000 mg | ORAL_TABLET | Freq: Every day | ORAL | 0 refills | Status: DC

## 2018-07-27 MED ORDER — AZELASTINE HCL 0.1 % NA SOLN: 2.0000 | Freq: Two times a day (BID) | NASAL | 12 refills | Status: DC

## 2018-07-27 NOTE — Discharge Instructions (Signed)
You are no better within the next 5 to 7 days then please get the antibiotic filled called Zithromax, take this daily for 5 days.  Your x-ray and blood work were all very normal with no signs of pneumonia, no signs of infection other than what is likely a virus infection.  That being said you have been sick for approximately 3 weeks without any improvement so this may help.  Instead of Flonase you may try azelastine as an alternative nasal spray, if you can tolerate medicine such as Sudafed that would also be helpful to help decongest her nose and prevent nasal drainage.  Emergency department for severe or worsening symptoms

## 2018-07-27 NOTE — ED Notes (Signed)
Pt ambulatory to waiting room. Pt verbalized understanding of discharge instructions.   

## 2018-07-27 NOTE — ED Provider Notes (Signed)
[facility] EMERGENCY DEPARTMENT Provider Note   CSN: [id number] Arrival date & time: 07/27/18  1651     History   Chief Complaint Chief Complaint  Patient presents with  . Chest Pain    HPI Teryl Gubler is a 42 y.o. female.  HPI  The patient is a 42 year old female, she has a known history of Hashimoto's thyroiditis as well as some hyperlipidemia, otherwise she has never had a history of cancer, she does use an estrogen patch but does not use oral estrogens, she does not smoke, she has no history of cancer.  She presents with a complaint of ongoing coughing and some chest discomfort associated with the coughing.  She reports a chronic history of upper respiratory symptoms including nasal symptoms, drainage, postnasal drip and a cough which she never seems to get over.  Over the last several weeks this is been persistent, she is working as a Lawyer.  She reports that when she gets home after school she has severe fatigue which is causing her to lay down and go to sleep early.  That being said she is able to perform at her job, she has not had any fevers or vomiting or diarrhea.  She feels like her legs might be slightly swollen.  She has had some recent trips to the beach where she was sitting in the car, she would take breaks intermittently.  She has never had a blood clot or heart problems.  At this time her symptoms are mild  Past Medical History:  Diagnosis Date  . Hyperlipidemia   . Meniere disease 01/22/2018  . Thyroid disease     Patient Active Problem List   Diagnosis Date Noted  . Family hx of colon cancer 01/26/2018  . Meniere disease 01/22/2018  . Hypersomnia 03/24/2016  . Insomnia 03/24/2016  . Allergic rhinitis 03/24/2016  . Obesity 02/01/2016  . Vitamin D deficiency 12/12/2014  . Hyperlipidemia 12/12/2014  . Hypothyroidism 12/09/2014  . Asthma, chronic 12/09/2014    Past Surgical History:  Procedure Laterality Date  . ABDOMINAL HYSTERECTOMY      . CESAREAN SECTION    . laporascopy       OB History   None      Home Medications    Prior to Admission medications   Medication Sig Start Date End Date Taking? Authorizing Provider  azelastine (ASTELIN) 0.1 % nasal spray Place 2 sprays into both nostrils 2 (two) times daily for 7 days. Use in each nostril as directed 07/27/18 08/03/18  Eber Hong, MD  azithromycin (ZITHROMAX Z-PAK) 250 MG tablet Take 1 tablet (250 mg total) by mouth daily. 500mg  PO day 1, then 250mg  PO days 205 07/27/18   Eber Hong, MD  Cholecalciferol (VITAMIN D3) 5000 units TABS Take 5,000 Units by mouth 2 (two) times daily.    [provider]  estradiol (VIVELLE-DOT) 0.05 MG/24HR patch APPLY 1 PATCH TOPICALLY TWICE A WEEK 03/16/18   Hawks, Christy A, FNP  liothyronine (CYTOMEL) 5 MCG tablet Take 1 tablet (5 mcg total) by mouth daily. 01/23/18   Reather Littler, MD  Magnesium Citrate 100 MG TABS Take 40 mg by mouth.     [provider]  naproxen (NAPROSYN) 500 MG tablet TAKE 1 TABLET BY MOUTH TWICE DAILY WITH  A  MEAL 01/01/18   Hawks, Christy A, FNP  Potassium (GNP POTASSIUM) 99 MG TABS Take by mouth.    [provider]  Probiotic Product (PROBIOTIC-10) CAPS Take by mouth.  [provider]  SYNTHROID 88 MCG tablet TAKE 1 TABLET BY MOUTH ONCE DAILY 06/25/18   Reather Littler, MD  vitamin B-12 (CYANOCOBALAMIN) 500 MCG tablet Take 500 mcg by mouth daily.    [provider]    Family History Family History  Problem Relation Age of Onset  . Hypertension Mother   . Heart disease Father   . Hypertension Brother   . Diabetes Maternal Grandfather   . Breast cancer Maternal Grandmother        9s  . Thyroid disease Neg Hx     Social History Social History   Tobacco Use  . Smoking status: Never Smoker  . Smokeless tobacco: Never Used  Substance Use Topics  . Alcohol use: No  . Drug use: No     Allergies   Doxycycline and Statins   Review of Systems Review of  Systems  All other systems reviewed and are negative.    Physical Exam Updated Vital Signs BP (!) 139/92 (BP Location: Right Arm)   Pulse 93   Temp 97.8 F (36.6 C) (Oral)   Resp 16   Ht 1.6 m (5\' 3" )   Wt 88.5 kg   LMP 02/04/2005   SpO2 98%   BMI 34.54 kg/m   Physical Exam  Constitutional: She appears well-developed and well-nourished. No distress.  HENT:  Head: Normocephalic and atraumatic.  Mouth/Throat: Oropharynx is clear and moist. No oropharyngeal exudate.  Eyes: Pupils are equal, round, and reactive to light. Conjunctivae and EOM are normal. Right eye exhibits no discharge. Left eye exhibits no discharge. No scleral icterus.  Neck: Normal range of motion. Neck supple. No JVD present. No thyromegaly present.  Cardiovascular: Normal rate, regular rhythm, normal heart sounds and intact distal pulses. Exam reveals no gallop and no friction rub.  No murmur heard. Pulmonary/Chest: Effort normal and breath sounds normal. No respiratory distress. She has no wheezes. She has no rales.  Abdominal: Soft. Bowel sounds are normal. She exhibits no distension and no mass. There is no tenderness.  Musculoskeletal: Normal range of motion. She exhibits no edema or tenderness.  Lymphadenopathy:    She has no cervical adenopathy.  Neurological: She is alert. Coordination normal.  Skin: Skin is warm and dry. No rash noted. No erythema.  Psychiatric: She has a normal mood and affect. Her behavior is normal.  Nursing note and vitals reviewed.    ED Treatments / Results  Labs (all labs ordered are listed, but only abnormal results are displayed) Labs Reviewed  BASIC METABOLIC PANEL - Abnormal; Notable for the following components:      Result Value   Creatinine, Ser 1.10 (*)    All other components within normal limits  CBC  TROPONIN I  D-DIMER, QUANTITATIVE (NOT AT [facility])    EKG None  Radiology Dg Chest 2 View  Result Date: 07/27/2018 CLINICAL DATA:  Chest pain EXAM: CHEST  - 2 VIEW COMPARISON:  None. FINDINGS: The heart size and mediastinal contours are within normal limits. Both lungs are clear. The visualized skeletal structures are unremarkable. IMPRESSION: No active cardiopulmonary disease. Electronically Signed   By: Jasmine Pang M.D.   On: 07/27/2018 17:47    Procedures Procedures (including critical care time)  Medications Ordered in ED Medications - No data to display   Initial Impression / Assessment and Plan / ED Course  I have reviewed the triage vital signs and the nursing notes.  Pertinent labs & imaging results that were available during my care of  the patient were reviewed by me and considered in my medical decision making (see chart for details).  Clinical Course as of Jul 28 1907  Fri Jul 27, 2018  1856 D dimer negative   [BM]    Clinical Course User Index [BM] Eber HongMiller, Lynzee Lindquist, MD    Chest x-ray is unremarkable, there is no acute findings, her EKG is also unremarkable with no acute findings.  The lab work is totally normal with a normal troponin and blood counts.  We will add a d-dimer, anticipate supportive care if d-dimer is negative.  Chest x-ray d-dimer and other lab work is normal, patient will be informed, will treat for possible atypical infection, will also give alternative nasal spray to Flonase, patient agreeable  Final Clinical Impressions(s) / ED Diagnoses   Final diagnoses:  Upper respiratory tract infection, unspecified type    ED Discharge Orders         Ordered    azithromycin (ZITHROMAX Z-PAK) 250 MG tablet  Daily     07/27/18 1906    azelastine (ASTELIN) 0.1 % nasal spray  2 times daily     07/27/18 1906           Eber HongMiller, Rosalind Guido, MD 07/27/18 Windell [id number]

## 2018-07-27 NOTE — ED Triage Notes (Signed)
Pt with mid chest tightness for "awhile" and pt admits to weight gain as well.  Recent bilateral ankle swelling.

## 2018-07-27 NOTE — ED Notes (Signed)
Pt reports that she has "several diseases" and is followed by an endocrinologist  has a lot of URI that do not seem to clear so [place] and started a herbal med for her thyroid yesterday  Saw the holistic doctor on [place] who advised her to go to hospital and get a CT with contrast for a suspicion of "blood clot"  Pt was not seen Weds and is here today for weight gain over a period of ? Months since she had her thyroid meds changed And for her lingering cough and URI sx as well as the referral for a CT

## 2018-07-28 ENCOUNTER — Other Ambulatory Visit: Payer: Self-pay | Admitting: Family

## 2018-07-28 DIAGNOSIS — Z79890 Hormone replacement therapy (postmenopausal): Secondary | ICD-10-CM

## 2018-07-30 NOTE — Telephone Encounter (Signed)
Last physical 02/07/17

## 2018-08-27 ENCOUNTER — Other Ambulatory Visit: Payer: Self-pay | Admitting: Family

## 2018-08-27 ENCOUNTER — Ambulatory Visit
Admission: RE | Admit: 2018-08-27 | Discharge: 2018-08-27 | Disposition: A | Discharge status: Home / Self Care | Payer: BLUE CROSS/BLUE SHIELD | Source: Ambulatory Visit | Attending: Family | Admitting: Family

## 2018-08-27 DIAGNOSIS — N6313 Unspecified lump in the right breast, lower outer quadrant: Secondary | ICD-10-CM | POA: Diagnosis not present

## 2018-08-27 DIAGNOSIS — N6310 Unspecified lump in the right breast, unspecified quadrant: Secondary | ICD-10-CM

## 2018-08-27 DIAGNOSIS — N63 Unspecified lump in unspecified breast: Secondary | ICD-10-CM

## 2018-08-27 DIAGNOSIS — R922 Inconclusive mammogram: Secondary | ICD-10-CM | POA: Diagnosis not present

## 2018-08-27 DIAGNOSIS — N6314 Unspecified lump in the right breast, lower inner quadrant: Secondary | ICD-10-CM | POA: Diagnosis not present

## 2019-01-14 ENCOUNTER — Ambulatory Visit (INDEPENDENT_AMBULATORY_CARE_PROVIDER_SITE_OTHER): Payer: BLUE CROSS/BLUE SHIELD | Admitting: Family

## 2019-01-14 ENCOUNTER — Encounter: Payer: Self-pay | Admitting: Family

## 2019-01-14 VITALS — BP 146/103 | HR 85 | Temp 98.6°F | Ht 63.0 in | Wt 202.6 lb

## 2019-01-14 DIAGNOSIS — E039 Hypothyroidism, unspecified: Secondary | ICD-10-CM | POA: Diagnosis not present

## 2019-01-14 DIAGNOSIS — Z79890 Hormone replacement therapy (postmenopausal): Secondary | ICD-10-CM

## 2019-01-14 DIAGNOSIS — Z0001 Encounter for general adult medical examination with abnormal findings: Secondary | ICD-10-CM | POA: Diagnosis not present

## 2019-01-14 DIAGNOSIS — R5383 Other fatigue: Secondary | ICD-10-CM | POA: Diagnosis not present

## 2019-01-14 DIAGNOSIS — G4700 Insomnia, unspecified: Secondary | ICD-10-CM

## 2019-01-14 DIAGNOSIS — E559 Vitamin D deficiency, unspecified: Secondary | ICD-10-CM

## 2019-01-14 DIAGNOSIS — Z0000 Encounter for general adult medical examination without abnormal findings: Secondary | ICD-10-CM | POA: Diagnosis not present

## 2019-01-14 DIAGNOSIS — J4520 Mild intermittent asthma, uncomplicated: Secondary | ICD-10-CM

## 2019-01-14 DIAGNOSIS — E785 Hyperlipidemia, unspecified: Secondary | ICD-10-CM

## 2019-01-14 MED ORDER — AZELASTINE HCL 0.1 % NA SOLN: 2.0000 | Freq: Two times a day (BID) | NASAL | 12 refills | Status: AC

## 2019-01-14 MED ORDER — ESTRADIOL 0.05 MG/24HR TD PTTW: MEDICATED_PATCH | TRANSDERMAL | 1 refills | Status: DC

## 2019-01-14 NOTE — Patient Instructions (Signed)
Hypothyroidism  Hypothyroidism is when the thyroid gland does not make enough of certain hormones (it is underactive). The thyroid gland is a small gland located in the lower front part of the neck, just in front of the windpipe (trachea). This gland makes hormones that help control how the body uses food for energy (metabolism) as well as how the heart and brain function. These hormones also play a role in keeping your bones strong. When the thyroid is underactive, it produces too little of the hormones thyroxine (T4) and triiodothyronine (T3). What are the causes? This condition may be caused by:  Hashimoto's disease. This is a disease in which the body's disease-fighting system (immune system) attacks the thyroid gland. This is the most common cause.  Viral infections.  Pregnancy.  Certain medicines.  Birth defects.  Past radiation treatments to the head or neck for cancer.  Past treatment with radioactive iodine.  Past exposure to radiation in the environment.  Past surgical removal of part or all of the thyroid.  Problems with a gland in the center of the brain (pituitary gland).  Lack of enough iodine in the diet. What increases the risk? You are more likely to develop this condition if:  You are female.  You have a family history of thyroid conditions.  You use a medicine called lithium.  You take medicines that affect the immune system (immunosuppressants). What are the signs or symptoms? Symptoms of this condition include:  Feeling as though you have no energy (lethargy).  Not being able to tolerate cold.  Weight gain that is not explained by a change in diet or exercise habits.  Lack of appetite.  Dry skin.  Coarse hair.  Menstrual irregularity.  Slowing of thought processes.  Constipation.  Sadness or depression. How is this diagnosed? This condition may be diagnosed based on:  Your symptoms, your medical history, and a physical exam.  Blood  tests. You may also have imaging tests, such as an ultrasound or MRI. How is this treated? This condition is treated with medicine that replaces the thyroid hormones that your body does not make. After you begin treatment, it may take several weeks for symptoms to go away. Follow these instructions at home:  Take over-the-counter and prescription medicines only as told by your health care provider.  If you start taking any new medicines, tell your health care provider.  Keep all follow-up visits as told by your health care provider. This is important. ? As your condition improves, your dosage of thyroid hormone medicine may change. ? You will need to have blood tests regularly so that your health care provider can monitor your condition. Contact a health care provider if:  Your symptoms do not get better with treatment.  You are taking thyroid replacement medicine and you: ? Sweat a lot. ? Have tremors. ? Feel anxious. ? Lose weight rapidly. ? Cannot tolerate heat. ? Have emotional swings. ? Have diarrhea. ? Feel weak. Get help right away if you have:  Chest pain.  An irregular heartbeat.  A rapid heartbeat.  Difficulty breathing. Summary  Hypothyroidism is when the thyroid gland does not make enough of certain hormones (it is underactive).  When the thyroid is underactive, it produces too little of the hormones thyroxine (T4) and triiodothyronine (T3).  The most common cause is Hashimoto's disease, a disease in which the body's disease-fighting system (immune system) attacks the thyroid gland. The condition can also be caused by viral infections, medicine, pregnancy, or past   radiation treatment to the head or neck.  Symptoms may include weight gain, dry skin, constipation, feeling as though you do not have energy, and not being able to tolerate cold.  This condition is treated with medicine to replace the thyroid hormones that your body does not make. This information  is not intended to replace advice given to you by your health care provider. Make sure you discuss any questions you have with your health care provider. Document Released: 10/31/2005 Document Revised: 10/11/2017 Document Reviewed: 10/11/2017 Elsevier Interactive Patient Education  2019 [employer]  

## 2019-01-14 NOTE — Progress Notes (Signed)
Subjective:    Patient ID: Tonya Hoffman, female    DOB: 1976/06/23, 43 y.o.   MRN: [id number]  Chief Complaint  Patient presents with  . Annual Exam   PT presents to the office today for CPE. She has not taken her levothyroxine since 07/2018 and has been taking herbal medications. Complaining of fatigue.  Asthma  She complains of cough and sputum production. There is no wheezing. This is a chronic problem. The current episode started more than 1 year ago. The problem occurs intermittently. The cough is non-productive. Her past medical history is significant for asthma.  Thyroid Problem  Presents for follow-up visit. Symptoms include constipation, dry skin, fatigue and weight gain. The symptoms have been stable. Her past medical history is significant for hyperlipidemia.  Insomnia  Primary symptoms: difficulty falling asleep.  The current episode started more than one year. The onset quality is gradual. The problem occurs intermittently. The problem has been waxing and waning since onset. The treatment provided moderate relief.  Hyperlipidemia  This is a chronic problem. The current episode started more than 1 year ago. The problem is uncontrolled. Recent lipid tests were reviewed and are high. Exacerbating diseases include obesity. Current antihyperlipidemic treatment includes diet change. The current treatment provides no improvement of lipids. Risk factors for coronary artery disease include dyslipidemia and a sedentary lifestyle.      Review of Systems  Constitutional: Positive for fatigue and weight gain.  Respiratory: Positive for cough and sputum production. Negative for wheezing.   Gastrointestinal: Positive for constipation.  Psychiatric/Behavioral: The patient has insomnia.   All other systems reviewed and are negative.      Objective:   Physical Exam Vitals signs reviewed.  Constitutional:      General: She is not in acute distress.    Appearance: She is well-developed.   HENT:     Head: Normocephalic and atraumatic.     Right Ear: Tympanic membrane normal.     Left Ear: Tympanic membrane normal.  Eyes:     Pupils: Pupils are equal, round, and reactive to light.  Neck:     Musculoskeletal: Normal range of motion and neck supple.     Thyroid: No thyromegaly.  Cardiovascular:     Rate and Rhythm: Normal rate and regular rhythm.     Heart sounds: Normal heart sounds. No murmur.  Pulmonary:     Effort: Pulmonary effort is normal. No respiratory distress.     Breath sounds: Normal breath sounds. No wheezing.  Abdominal:     General: Bowel sounds are normal. There is no distension.     Palpations: Abdomen is soft.     Tenderness: There is no abdominal tenderness.  Musculoskeletal: Normal range of motion.        General: No tenderness.  Skin:    General: Skin is warm and dry.  Neurological:     Mental Status: She is alert and oriented to person, place, and time.     Cranial Nerves: No cranial nerve deficit.     Deep Tendon Reflexes: Reflexes are normal and symmetric.  Psychiatric:        Behavior: Behavior normal.        Thought Content: Thought content normal.        Judgment: Judgment normal.        BP (!) 146/103   Pulse 85   Temp 98.6 F (37 C) (Oral)   Ht '5\' 3"'$  (1.6 m)   Wt 202 lb 9.6 oz (91.9  kg)   LMP 02/04/2005   BMI 35.89 kg/m   Assessment & Plan:  Tonya Hoffman comes in today with chief complaint of Annual Exam   Diagnosis and orders addressed:  1. Annual physical exam - CMP14+EGFR - CBC with Differential/Platelet - Lipid panel - Thyroid Panel With TSH - VITAMIN D 25 Hydroxy (Vit-D Deficiency, Fractures)  2. Mild intermittent chronic asthma without complication - UNG76+BOMQ - CBC with Differential/Platelet  3. Hypothyroidism, unspecified type - CMP14+EGFR - CBC with Differential/Platelet - Thyroid Panel With TSH - Thyroid Peroxidase Antibody  4. Hyperlipidemia, unspecified hyperlipidemia type - CMP14+EGFR - CBC  with Differential/Platelet - Lipid panel  5. Vitamin D deficiency - CMP14+EGFR - CBC with Differential/Platelet - VITAMIN D 25 Hydroxy (Vit-D Deficiency, Fractures)  6. Morbid obesity ([place]) - CMP14+EGFR - CBC with Differential/Platelet  7. Insomnia, unspecified type - CMP14+EGFR - CBC with Differential/Platelet  8. Fatigue, unspecified type - CMP14+EGFR - CBC with Differential/Platelet - Vitamin B12 - Cortisol-pm, blood - Epstein-Barr virus VCA antibody panel   Labs pending Health Maintenance reviewed Diet and exercise encouraged  Follow up plan: 6 months    Evelina Dun, FNP

## 2019-01-14 NOTE — Addendum Note (Signed)
Addended by: Jannifer Rodney A on: 01/14/2019 04:22 PM   Modules accepted: Orders

## 2019-01-15 LAB — CBC WITH DIFFERENTIAL/PLATELET
BASOS ABS: 0.1 10*3/uL (ref 0.0–0.2)
Basos: 1 %
EOS (ABSOLUTE): 0.1 10*3/uL (ref 0.0–0.4)
Eos: 1 %
HEMOGLOBIN: 13.6 g/dL (ref 11.1–15.9)
Hematocrit: 39.8 % (ref 34.0–46.6)
IMMATURE GRANS (ABS): 0 10*3/uL (ref 0.0–0.1)
Immature Granulocytes: 0 %
Lymphocytes Absolute: 1.7 10*3/uL (ref 0.7–3.1)
Lymphs: 27 %
MCH: 28.2 pg (ref 26.6–33.0)
MCHC: 34.2 g/dL (ref 31.5–35.7)
MCV: 83 fL (ref 79–97)
Monocytes Absolute: 0.5 10*3/uL (ref 0.1–0.9)
Monocytes: 7 %
Neutrophils Absolute: 4 10*3/uL (ref 1.4–7.0)
Neutrophils: 64 %
PLATELETS: 359 10*3/uL (ref 150–450)
RBC: 4.82 x10E6/uL (ref 3.77–5.28)
RDW: 12.6 % (ref 11.7–15.4)
WBC: 6.3 10*3/uL (ref 3.4–10.8)

## 2019-01-15 LAB — CMP14+EGFR
A/G RATIO: 1.6 (ref 1.2–2.2)
ALK PHOS: 86 IU/L (ref 39–117)
ALT: 14 IU/L (ref 0–32)
AST: 17 IU/L (ref 0–40)
Albumin: 4.2 g/dL (ref 3.8–4.8)
BILIRUBIN TOTAL: 0.2 mg/dL (ref 0.0–1.2)
BUN/Creatinine Ratio: 7 — ABNORMAL LOW (ref 9–23)
BUN: 8 mg/dL (ref 6–24)
CHLORIDE: 104 mmol/L (ref 96–106)
CO2: 24 mmol/L (ref 20–29)
Calcium: 8.9 mg/dL (ref 8.7–10.2)
Creatinine, Ser: 1.07 mg/dL — ABNORMAL HIGH (ref 0.57–1.00)
GFR calc Af Amer: 73 mL/min/{1.73_m2} (ref >59)
GFR calc non Af Amer: 63 mL/min/{1.73_m2} (ref >59)
Globulin, Total: 2.7 g/dL (ref 1.5–4.5)
Glucose: 93 mg/dL (ref 65–99)
POTASSIUM: 4.6 mmol/L (ref 3.5–5.2)
Sodium: 143 mmol/L (ref 134–144)
Total Protein: 6.9 g/dL (ref 6.0–8.5)

## 2019-01-15 LAB — LIPID PANEL
CHOLESTEROL TOTAL: 237 mg/dL — AB (ref 100–199)
Chol/HDL Ratio: 6.1 ratio — ABNORMAL HIGH (ref 0.0–4.4)
HDL: 39 mg/dL — ABNORMAL LOW (ref >39)
LDL CALC: 153 mg/dL — AB (ref 0–99)
TRIGLYCERIDES: 223 mg/dL — AB (ref 0–149)
VLDL CHOLESTEROL CAL: 45 mg/dL — AB (ref 5–40)

## 2019-01-15 LAB — THYROID PANEL WITH TSH
Free Thyroxine Index: 1.2 (ref 1.2–4.9)
T3 UPTAKE RATIO: 20 % — AB (ref 24–39)
T4, Total: 5.9 ug/dL (ref 4.5–12.0)
TSH: 12.44 u[IU]/mL — ABNORMAL HIGH (ref 0.450–4.500)

## 2019-01-15 LAB — EPSTEIN-BARR VIRUS (EBV) ANTIBODY PROFILE
EBV NA IgG: 305 U/mL — ABNORMAL HIGH (ref 0.0–17.9)
EBV VCA IgM: <36 U/mL (ref 0.0–35.9)

## 2019-01-15 LAB — CORTISOL-PM, BLOOD: Cortisol - PM: 7.6 ug/dL (ref 2.3–11.9)

## 2019-01-15 LAB — VITAMIN D 25 HYDROXY (VIT D DEFICIENCY, FRACTURES): VIT D 25 HYDROXY: 39 ng/mL (ref 30.0–100.0)

## 2019-01-15 LAB — THYROID PEROXIDASE ANTIBODY: THYROID PEROXIDASE ANTIBODY: 131 [IU]/mL — AB (ref 0–34)

## 2019-01-15 LAB — VITAMIN B12: VITAMIN B 12: 1059 pg/mL (ref 232–1245)

## 2019-01-17 ENCOUNTER — Other Ambulatory Visit: Payer: Self-pay | Admitting: Family

## 2019-01-17 MED ORDER — SYNTHROID 88 MCG PO TABS: 88.0000 ug | ORAL_TABLET | Freq: Every day | ORAL | 1 refills | Status: AC

## 2019-01-28 ENCOUNTER — Telehealth: Payer: Self-pay | Admitting: Family

## 2019-01-28 NOTE — Telephone Encounter (Signed)
Left message to call back  

## 2019-01-30 ENCOUNTER — Telehealth: Payer: Self-pay | Admitting: Family

## 2019-01-30 NOTE — Telephone Encounter (Signed)
Patient states that she is needing a refill on Cytomel was not sent in with synthroid

## 2019-01-31 MED ORDER — LIOTHYRONINE SODIUM 5 MCG PO TABS: 5.0000 ug | ORAL_TABLET | Freq: Every day | ORAL | 1 refills | Status: AC

## 2019-01-31 NOTE — Telephone Encounter (Signed)
Prescription sent to pharmacy.

## 2019-01-31 NOTE — Telephone Encounter (Signed)
LMOM that med was called in

## 2019-02-01 NOTE — Telephone Encounter (Signed)
Medication sent to pharmacy  

## 2019-02-27 ENCOUNTER — Other Ambulatory Visit: Payer: BLUE CROSS/BLUE SHIELD

## 2019-06-26 DIAGNOSIS — E60 Dietary zinc deficiency: Secondary | ICD-10-CM | POA: Diagnosis not present

## 2019-06-26 DIAGNOSIS — E538 Deficiency of other specified B group vitamins: Secondary | ICD-10-CM | POA: Diagnosis not present

## 2019-06-26 DIAGNOSIS — Z131 Encounter for screening for diabetes mellitus: Secondary | ICD-10-CM | POA: Diagnosis not present

## 2019-06-26 DIAGNOSIS — E063 Autoimmune thyroiditis: Secondary | ICD-10-CM | POA: Diagnosis not present

## 2019-06-26 DIAGNOSIS — R6882 Decreased libido: Secondary | ICD-10-CM | POA: Diagnosis not present

## 2019-06-26 DIAGNOSIS — R5383 Other fatigue: Secondary | ICD-10-CM | POA: Diagnosis not present

## 2019-06-26 DIAGNOSIS — F418 Other specified anxiety disorders: Secondary | ICD-10-CM | POA: Diagnosis not present

## 2019-06-26 DIAGNOSIS — E559 Vitamin D deficiency, unspecified: Secondary | ICD-10-CM | POA: Diagnosis not present

## 2019-07-25 DIAGNOSIS — R6882 Decreased libido: Secondary | ICD-10-CM | POA: Diagnosis not present

## 2019-07-25 DIAGNOSIS — R5383 Other fatigue: Secondary | ICD-10-CM | POA: Diagnosis not present

## 2019-07-25 DIAGNOSIS — N951 Menopausal and female climacteric states: Secondary | ICD-10-CM | POA: Diagnosis not present

## 2019-07-25 DIAGNOSIS — E039 Hypothyroidism, unspecified: Secondary | ICD-10-CM | POA: Diagnosis not present

## 2019-07-25 DIAGNOSIS — E063 Autoimmune thyroiditis: Secondary | ICD-10-CM | POA: Diagnosis not present

## 2020-02-20 ENCOUNTER — Other Ambulatory Visit: Payer: Self-pay | Admitting: Family

## 2020-02-20 DIAGNOSIS — Z79890 Hormone replacement therapy (postmenopausal): Secondary | ICD-10-CM

## 2020-04-10 ENCOUNTER — Other Ambulatory Visit: Payer: Self-pay

## 2020-04-10 DIAGNOSIS — Z79890 Hormone replacement therapy (postmenopausal): Secondary | ICD-10-CM

## 2020-04-10 MED ORDER — ESTRADIOL 0.05 MG/24HR TD PTTW: MEDICATED_PATCH | TRANSDERMAL | 0 refills | Status: AC

## 2020-09-25 IMAGING — MG DIGITAL DIAGNOSTIC UNILATERAL RIGHT MAMMOGRAM WITH TOMO AND CAD
4 series · 4 of 12 positions shown · non-contrast
Comparison: Previous exam(s).

CLINICAL DATA: 42-year-old female presenting for follow-up of 2
probably benign masses in the right breast.

EXAM:
DIGITAL DIAGNOSTIC RIGHT MAMMOGRAM WITH CAD AND TOMO
ULTRASOUND RIGHT BREAST

[R MLO synth-2D]
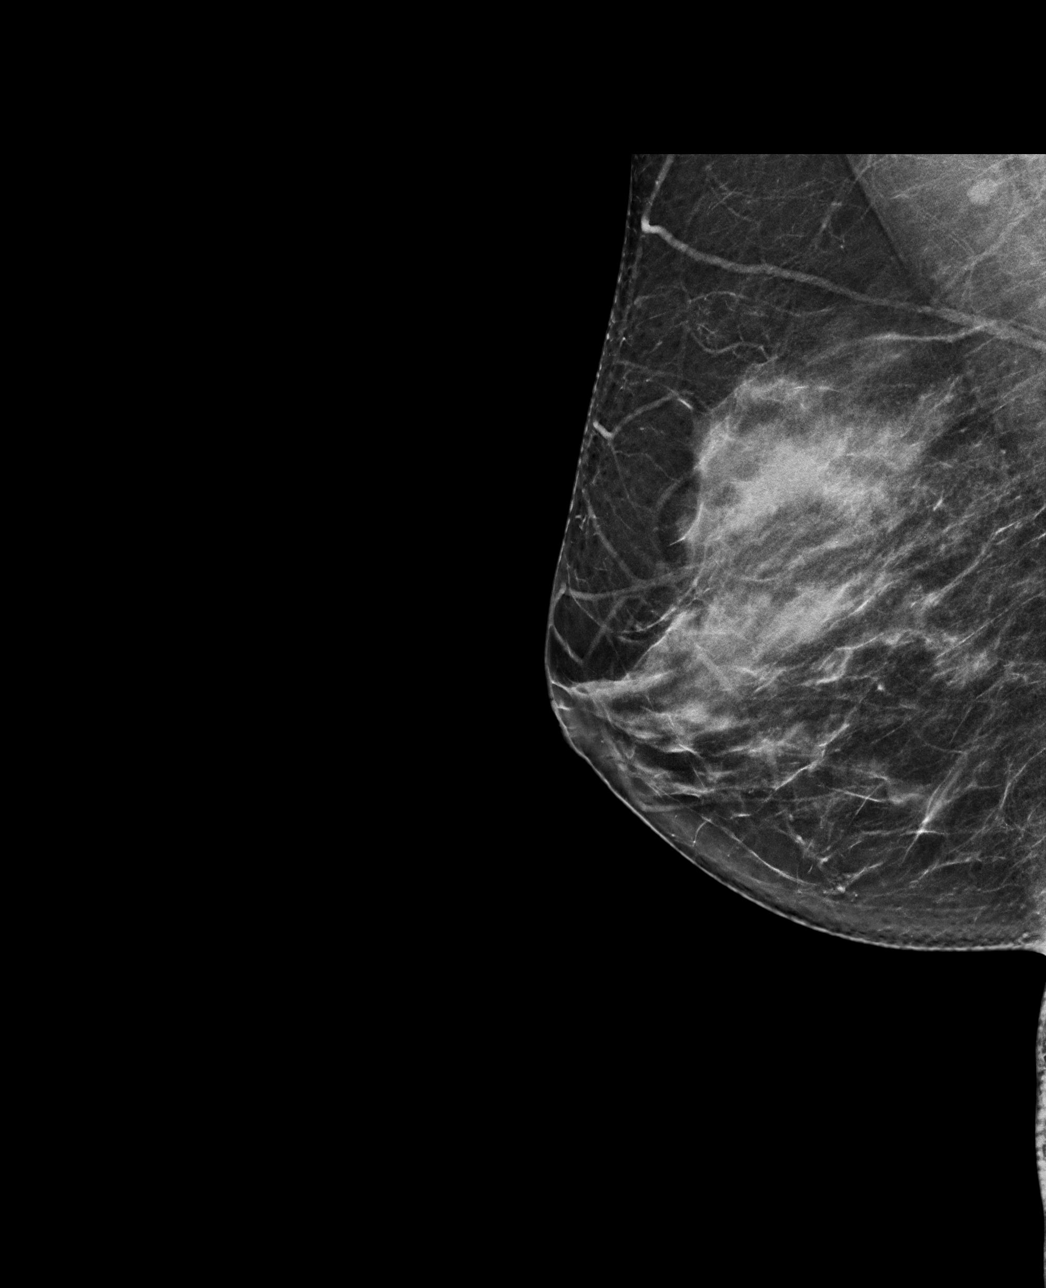

[R CC synth-2D]
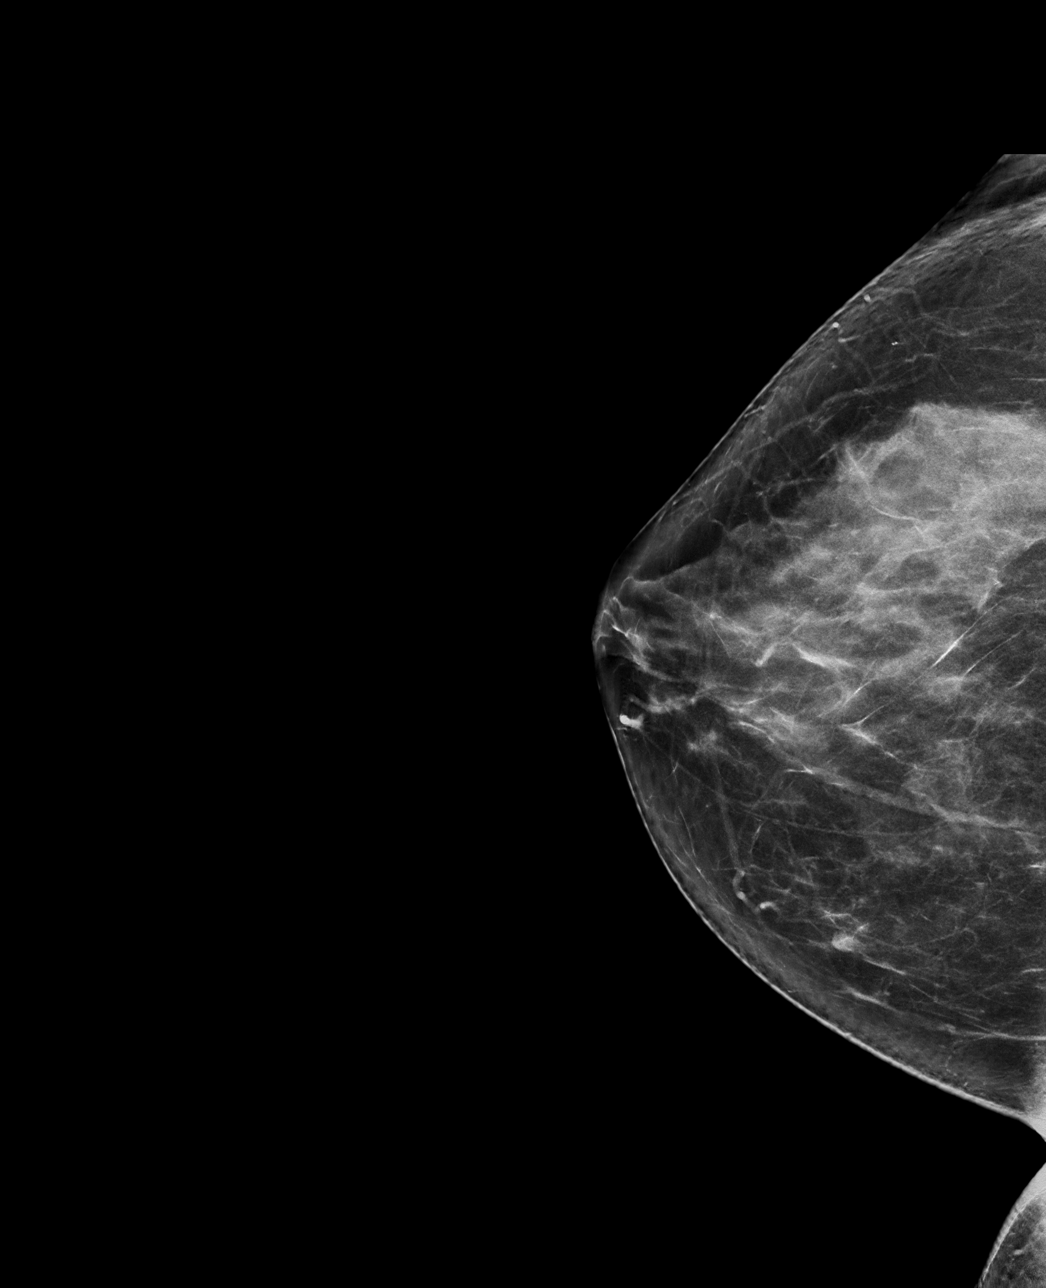

[R MLO tomo · tomo slice 36/71.0]
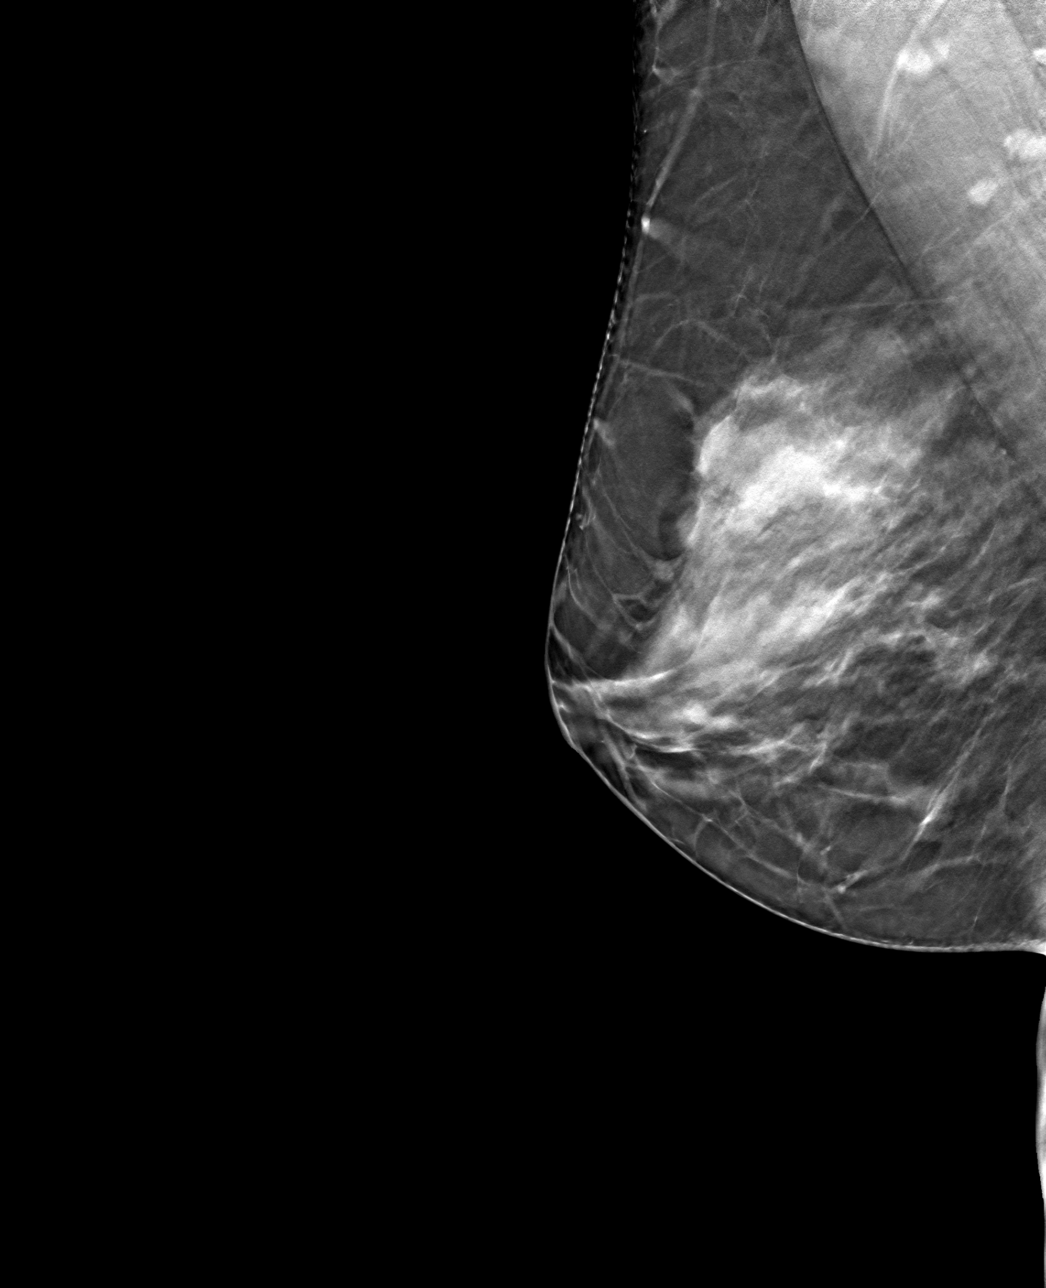

[R CC tomo · tomo slice 37/74.0]
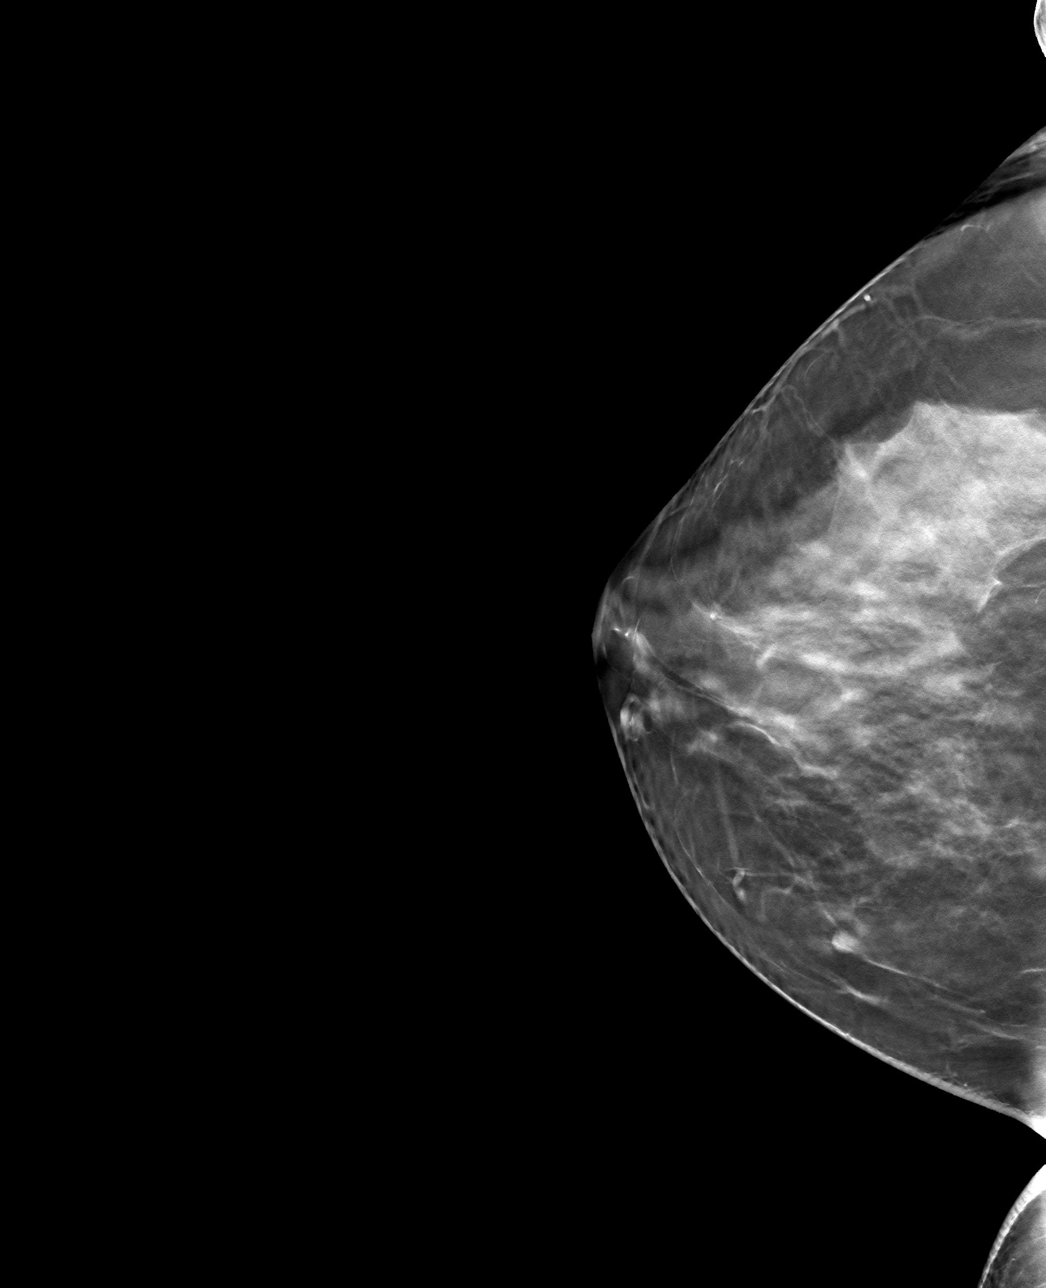

[4 of 12 positions shown; findings below may reference images not displayed]

ACR Breast Density Category c: The breast tissue is heterogeneously
dense, which may obscure small masses.
FINDINGS: No suspicious calcifications, masses or areas of distortion are seen
in the right breast.

Mammographic images were processed with CAD.

Ultrasound of the right breast at 6 o'clock, in the subareolar right
breast demonstrates a stable circumscribed hypoechoic mass measuring
8 x 3 x 8 mm, previously measuring 8 x 3 x 7 mm

Ultrasound of the right breast at 3 o'clock, 5 cm from the nipple
demonstrates a stable hypoechoic circumscribed mass measuring 5 x 3
x 5 mm, previously measuring 5 x 2 x 4 mm.
IMPRESSION: The 2 probably benign masses in the right breast are stable.

RECOMMENDATION:
Six-month follow-up bilateral diagnostic mammogram and right breast
ultrasound is recommended.

I have discussed the findings and recommendations with the patient.
Results were also provided in writing at the conclusion of the
visit. If applicable, a reminder letter will be sent to the patient
regarding the next appointment.

BI-RADS CATEGORY  3: Probably benign.
